# Patient Record
Sex: Female | Born: 1995
Health system: Southern US, Community
[De-identification: ages and names within clinical notes are randomized; demographics above are authoritative.]

## PROBLEM LIST (undated history)

## (undated) DIAGNOSIS — D649 Anemia, unspecified: Secondary | ICD-10-CM

## (undated) HISTORY — PX: EYE SURGERY: SHX253

---

## 2016-11-24 ENCOUNTER — Ambulatory Visit (INDEPENDENT_AMBULATORY_CARE_PROVIDER_SITE_OTHER): Payer: Medicaid Other | Admitting: Student

## 2016-11-24 ENCOUNTER — Encounter: Payer: Self-pay | Admitting: Student

## 2016-11-24 DIAGNOSIS — Z3400 Encounter for supervision of normal first pregnancy, unspecified trimester: Secondary | ICD-10-CM

## 2016-11-24 DIAGNOSIS — O0932 Supervision of pregnancy with insufficient antenatal care, second trimester: Secondary | ICD-10-CM

## 2016-11-24 DIAGNOSIS — O0931 Supervision of pregnancy with insufficient antenatal care, first trimester: Secondary | ICD-10-CM | POA: Insufficient documentation

## 2016-11-24 LAB — POCT URINALYSIS DIP (DEVICE)
BILIRUBIN URINE: NEGATIVE
Glucose, UA: NEGATIVE mg/dL
HGB URINE DIPSTICK: NEGATIVE
KETONES UR: NEGATIVE mg/dL
NITRITE: NEGATIVE
PH: 7.5 (ref 5.0–8.0)
Protein, ur: 30 mg/dL — AB
SPECIFIC GRAVITY, URINE: 1.02 (ref 1.005–1.030)
Urobilinogen, UA: 0.2 mg/dL (ref 0.0–1.0)

## 2016-11-24 NOTE — Patient Instructions (Signed)
Second Trimester of Pregnancy The second trimester is from week 13 through week 28, month 4 through 6. This is often the time in pregnancy that you feel your best. Often times, morning sickness has lessened or quit. You may have more energy, and you may get hungry more often. Your unborn baby (fetus) is growing rapidly. At the end of the sixth month, he or she is about 9 inches long and weighs about 1 pounds. You will likely feel the baby move (quickening) between 18 and 20 weeks of pregnancy. Follow these instructions at home:  Avoid all smoking, herbs, and alcohol. Avoid drugs not approved by your doctor.  Do not use any tobacco products, including cigarettes, chewing tobacco, and electronic cigarettes. If you need help quitting, ask your doctor. You may get counseling or other support to help you quit.  Only take medicine as told by your doctor. Some medicines are safe and some are not during pregnancy.  Exercise only as told by your doctor. Stop exercising if you start having cramps.  Eat regular, healthy meals.  Wear a good support bra if your breasts are tender.  Do not use hot tubs, steam rooms, or saunas.  Wear your seat belt when driving.  Avoid raw meat, uncooked cheese, and liter boxes and soil used by cats.  Take your prenatal vitamins.  Take 1500-2000 milligrams of calcium daily starting at the 20th week of pregnancy until you deliver your baby.  Try taking medicine that helps you poop (stool softener) as needed, and if your doctor approves. Eat more fiber by eating fresh fruit, vegetables, and whole grains. Drink enough fluids to keep your pee (urine) clear or pale yellow.  Take warm water baths (sitz baths) to soothe pain or discomfort caused by hemorrhoids. Use hemorrhoid cream if your doctor approves.  If you have puffy, bulging veins (varicose veins), wear support hose. Raise (elevate) your feet for 15 minutes, 3-4 times a day. Limit salt in your diet.  Avoid heavy  lifting, wear low heals, and sit up straight.  Rest with your legs raised if you have leg cramps or low back pain.  Visit your dentist if you have not gone during your pregnancy. Use a soft toothbrush to brush your teeth. Be gentle when you floss.  You can have sex (intercourse) unless your doctor tells you not to.  Go to your doctor visits. Get help if:  You feel dizzy.  You have mild cramps or pressure in your lower belly (abdomen).  You have a nagging pain in your belly area.  You continue to feel sick to your stomach (nauseous), throw up (vomit), or have watery poop (diarrhea).  You have bad smelling fluid coming from your vagina.  You have pain with peeing (urination). Get help right away if:  You have a fever.  You are leaking fluid from your vagina.  You have spotting or bleeding from your vagina.  You have severe belly cramping or pain.  You lose or gain weight rapidly.  You have trouble catching your breath and have chest pain.  You notice sudden or extreme puffiness (swelling) of your face, hands, ankles, feet, or legs.  You have not felt the baby move in over an hour.  You have severe headaches that do not go away with medicine.  You have vision changes. This information is not intended to replace advice given to you by your health care provider. Make sure you discuss any questions you have with your health care   provider. Document Released: 03/10/2010 Document Revised: 05/21/2016 Document Reviewed: 02/14/2013 Elsevier Interactive Patient Education  2017 [employer]  

## 2016-11-24 NOTE — Assessment & Plan Note (Signed)
Did not know she was pregnant; she thinks she is 25 weeks and 2 days

## 2016-11-24 NOTE — Progress Notes (Signed)
   PRENATAL VISIT NOTE  Subjective:  Patricia Rich is a 20 y.o. G1P0 at 6853w2d being seen today for ongoing prenatal care.  She is currently monitored for the following issues for this low-risk pregnancy and has Supervision of normal first pregnancy and Limited prenatal care in first trimester on her problem list.  Patient reports no complaints.  Contractions: Not present. Vag. Bleeding: None.  Movement: Absent. Denies leaking of fluid.   The following portions of the patient's history were reviewed and updated as appropriate: allergies, current medications, past family history, past medical history, past social history, past surgical history and problem list. Problem list updated.  Objective:   Vitals:   11/24/16 1246  BP: 109/66  Pulse: 90  Weight: 67 kg (147 lb 9.6 oz)    Fetal Status:     Movement: Absent     General:  Alert, oriented and cooperative. Patient is in no acute distress.  Skin: Skin is warm and dry. No rash noted.   Cardiovascular: Normal heart rate noted  Respiratory: Normal respiratory effort, no problems with respiration noted  Abdomen: Soft, gravid, appropriate for gestational age. Pain/Pressure: Absent     Pelvic:  Cervical exam deferred        Extremities: Normal range of motion.  Edema: None  Mental Status: Normal mood and affect. Normal behavior. Normal judgment and thought content.   Assessment and Plan:  Pregnancy: G1P0 at 653w2d  1. Supervision of normal first pregnancy, antepartum Patient feels  - Prenatal Profile - Culture, OB Urine - GC/Chlamydia probe amp ([place])[facility] - US MFM OB COMP + 14 WK; Future  2. Limited prenatal care in first trimester Patient has very unpredictable periods and did not know she was pregnant, and then had trouble getting an appointment. Answered questions today about genetic testing, US, series of appointments.   Preterm labor symptoms and general obstetric precautions including but not limited to  vaginal bleeding, contractions, leaking of fluid and fetal movement were reviewed in detail with the patient. Please refer to After Visit Summary for other counseling recommendations.  No Follow-up on file. Return in 3 weeks; 2 Hr GTT at that time.   Marylene LandKathryn Lorraine Kooistra, CNM

## 2016-11-25 LAB — PRENATAL PROFILE (SOLSTAS)
Antibody Screen: NEGATIVE
BASOS PCT: 0 %
Basophils Absolute: 0 cells/uL (ref 0–200)
Eosinophils Absolute: 92 cells/uL (ref 15–500)
Eosinophils Relative: 1 %
HEMATOCRIT: 32.3 % — AB (ref 35.0–45.0)
HEMOGLOBIN: 10.7 g/dL — AB (ref 11.7–15.5)
HEP B S AG: NEGATIVE
HIV: NONREACTIVE
LYMPHS ABS: 1196 {cells}/uL (ref 850–3900)
LYMPHS PCT: 13 %
MCH: 31 pg (ref 27.0–33.0)
MCHC: 33.1 g/dL (ref 32.0–36.0)
MCV: 93.6 fL (ref 80.0–100.0)
MONO ABS: 736 {cells}/uL (ref 200–950)
MPV: 10.5 fL (ref 7.5–12.5)
Monocytes Relative: 8 %
Neutro Abs: 7176 cells/uL (ref 1500–7800)
Neutrophils Relative %: 78 %
Platelets: 243 10*3/uL (ref 140–400)
RBC: 3.45 MIL/uL — AB (ref 3.80–5.10)
RDW: 13.2 % (ref 11.0–15.0)
RH TYPE: NEGATIVE
Rubella: 2.32 Index — ABNORMAL HIGH (ref <0.90)
WBC: 9.2 10*3/uL (ref 3.8–10.8)

## 2016-11-26 LAB — CULTURE, OB URINE

## 2016-11-30 ENCOUNTER — Ambulatory Visit (HOSPITAL_COMMUNITY)
Admission: RE | Admit: 2016-11-30 | Discharge: 2016-11-30 | Disposition: A | Discharge status: Home / Self Care | Payer: Medicaid Other | Source: Ambulatory Visit | Attending: Student | Admitting: Student

## 2016-11-30 DIAGNOSIS — Z363 Encounter for antenatal screening for malformations: Secondary | ICD-10-CM | POA: Diagnosis present

## 2016-11-30 DIAGNOSIS — Z3A18 18 weeks gestation of pregnancy: Secondary | ICD-10-CM | POA: Diagnosis not present

## 2016-11-30 DIAGNOSIS — Z3400 Encounter for supervision of normal first pregnancy, unspecified trimester: Secondary | ICD-10-CM

## 2016-12-17 ENCOUNTER — Ambulatory Visit (INDEPENDENT_AMBULATORY_CARE_PROVIDER_SITE_OTHER): Payer: Medicaid Other | Admitting: Certified Nurse Midwife

## 2016-12-17 VITALS — BP 111/69 | HR 80 | Wt 149.1 lb

## 2016-12-17 DIAGNOSIS — Z3402 Encounter for supervision of normal first pregnancy, second trimester: Secondary | ICD-10-CM

## 2016-12-17 DIAGNOSIS — O0932 Supervision of pregnancy with insufficient antenatal care, second trimester: Secondary | ICD-10-CM

## 2016-12-17 DIAGNOSIS — O0931 Supervision of pregnancy with insufficient antenatal care, first trimester: Secondary | ICD-10-CM

## 2016-12-17 NOTE — Patient Instructions (Signed)
Rh Incompatibility Rh incompatibility is a condition that occurs during pregnancy if a woman has Rh-negative blood and her baby has Rh-positive blood. "Rh-negative" and "Rh-positive" refer to whether or not the blood has an Rh factor. An Rh factor is a specific protein found on the surface of red blood cells. If a woman has Rh factor, she is Rh-positive. If she does not have an Rh factor, she is Rh-negative. Having or not having an Rh factor does not affect the mother's general health. However, it can cause problems during pregnancy. What kind of problems can Rh incompatibility cause? During pregnancy, blood from the baby can cross into the mother's bloodstream, especially during delivery. If a mother is Rh-negative and the baby is Rh-positive, the mother's defense system will react to the baby's blood as if it was a foreign substance and will create proteins (antibodies). This is called sensitization. Once the mother is sensitized, her Rh antibodies will cross the placenta to the baby and attack the baby's Rh-positive blood as if it is a harmful substance. Rh incompatibility can also happen if the Rh-negative pregnant woman is exposed to the Rh factor during a blood transfusion with Rh-positive blood. How does this condition affect my baby? The Rh antibodies that attack and destroy the baby's red blood cells can lead to hemolytic disease in the baby. Hemolytic disease is when the red blood cells break down. This can cause:  Yellowing of the skin and eyes (jaundice).  The body to not have enough healthy red blood cells (anemia).  Brain damage.  Heart failure.  Death.  These antibodies usually do not cause problems during a first pregnancy. This is because the blood from the baby often times crosses into the mother's bloodstream during delivery, and the baby is born before many of the antibodies can develop. However, the antibodies stay in your body once they have formed. Because of this, Rh  incompatibility is more likely to cause problems in second or later pregnancies (if the baby is Rh-positive). How is this diagnosed? When a woman becomes pregnant, blood tests may be done to find out her blood type and Rh factor. If the woman is Rh-negative, she also may have another blood test called an antibody screen. The antibody screen shows whether she has Rh antibodies in her blood. If she does, it means she was exposed to Rh-positive blood before, and she is at risk for Rh incompatibility. To find out whether the baby is developing hemolytic anemia and how serious it is, caregivers may use more advanced tests, such as ultrasonography (commonly known as ultrasound). How is Rh incompatibility treated? Rh incompatibility is treated with a shot of medicine called Rho (D) immune globulin. This medicine keeps the woman's body from making antibodies that can cause serious problems in the baby or future babies. Two shots will be given, one at around your seventh month of pregnancy and the other within 72 hours of your baby being born. If you are Rh-negative, you will need this medicine every time you have a baby with Rh-positive blood. If you already have antibodies in your blood, Rho (D) immune globulin will not help. Your doctor will not give you this medicine, but will watch your pregnancy closely for problems instead. This shot may also be given to an Rh-negative woman when the risk of blood transfer between the mom and baby is high. The risk is high with:  An amniocentesis.  A miscarriage or an abortion.  An ectopic pregnancy.  Any   vaginal bleeding during pregnancy.  This information is not intended to replace advice given to you by your health care provider. Make sure you discuss any questions you have with your health care provider. Document Released: 06/05/2002 Document Revised: 05/21/2016 Document Reviewed: 03/28/2013 Elsevier Interactive Patient Education  2017 [employer]vier Inc.  

## 2016-12-17 NOTE — Progress Notes (Signed)
Subjective:  Patricia Rich is a 20 y.o. G1P0 at 1717w4d being seen today for ongoing prenatal care.  She is currently monitored for the following issues for this low-risk pregnancy and has Supervision of normal first pregnancy and Limited prenatal care in first trimester on her problem list.  Patient reports diarrhea x2-3 days. Family members have had similar sx. No fever. No vomiting. Stools are irregular, watery, and green..  Contractions: Not present. Vag. Bleeding: None.  Movement: Present. Denies leaking of fluid.   The following portions of the patient's history were reviewed and updated as appropriate: allergies, current medications, past family history, past medical history, past social history, past surgical history and problem list. Problem list updated.  Objective:   Vitals:   12/17/16 0914  BP: 111/69  Pulse: 80    Fetal Status: Fetal Heart Rate (bpm): 154   Movement: Present     General:  Alert, oriented and cooperative. Patient is in no acute distress.  Skin: Skin is warm and dry. No rash noted.   Cardiovascular: Normal heart rate noted  Respiratory: Normal respiratory effort, no problems with respiration noted  Abdomen: Soft, gravid, appropriate for gestational age. Pain/Pressure: Present     Pelvic: Vag. Bleeding: None     Cervical exam deferred        Extremities: Normal range of motion.  Edema: Trace  Mental Status: Normal mood and affect. Normal behavior. Normal judgment and thought content.   Urinalysis:      Assessment and Plan:  Pregnancy: G1P0 at 4917w4d  1. Encounter for supervision of normal first pregnancy in second trimester - increase hydration d/t diarrhea - notify office if persists >may need stool analysis  2. Limited prenatal care in first trimester   Preterm labor symptoms and general obstetric precautions including but not limited to vaginal bleeding, contractions, leaking of fluid and fetal movement were reviewed in detail with the  patient. Please refer to After Visit Summary for other counseling recommendations.  Return in about 4 weeks (around 01/14/2017).   Donette LarryMelanie Lannie Heaps, CNM

## 2016-12-28 NOTE — L&D Delivery Note (Signed)
Operative Delivery Note At 12:37 PM a viable female was delivered via Vaginal, [employer], operational).  Presentation: vertex; Position: Left,, Occiput,, Anterior; Station: +2  For fetal bradycardia.Pt assisted x 2 pulls with spont del of fetal head.  Verbal consent: obtained from patient.  Risks and benefits discussed in detail.  Risks include, but are not limited to the risks of anesthesia, bleeding, infection, damage to maternal tissues, fetal cephalhematoma.  There is also the risk of inability to effect vaginal delivery of the head, or shoulder dystocia that cannot be resolved by established maneuvers, leading to the need for emergency cesarean section.  APGAR:8 , 9; weight  .   Placenta status: sponr, shultz.   Cord:  with the following complications:none .  Cord pH: n/a  Anesthesia epidural:   Instruments: kiwi Episiotomy: None Lacerations: None Suture Repair: n/a Est. Blood Loss 100(mL):    Mom to postpartum.  Baby to Couplet care / Skin to Skin.  Wyvonnia Dusky 04/25/2017, 12:44 PM

## 2017-01-13 ENCOUNTER — Encounter: Payer: Self-pay | Admitting: Family Medicine

## 2017-01-13 ENCOUNTER — Encounter: Payer: Medicaid Other | Admitting: Family Medicine

## 2017-01-19 ENCOUNTER — Ambulatory Visit (INDEPENDENT_AMBULATORY_CARE_PROVIDER_SITE_OTHER): Payer: Medicaid Other | Admitting: Advanced Practice Midwife

## 2017-01-19 VITALS — BP 102/65 | HR 99 | Wt 159.2 lb

## 2017-01-19 DIAGNOSIS — Z23 Encounter for immunization: Secondary | ICD-10-CM | POA: Diagnosis present

## 2017-01-19 DIAGNOSIS — Z3402 Encounter for supervision of normal first pregnancy, second trimester: Secondary | ICD-10-CM

## 2017-01-19 NOTE — Progress Notes (Signed)
Will wait on Flu

## 2017-01-19 NOTE — Patient Instructions (Signed)
Third Trimester of Pregnancy The third trimester is from week 29 through week 40 (months 7 through 9). The third trimester is a time when the unborn baby (fetus) is growing rapidly. At the end of the ninth month, the fetus is about 20 inches in length and weighs 6-10 pounds. Body changes during your third trimester Your body goes through many changes during pregnancy. The changes vary from woman to woman. During the third trimester:  Your weight will continue to increase. You can expect to gain 25-35 pounds (11-16 kg) by the end of the pregnancy.  You may begin to get stretch marks on your hips, abdomen, and breasts.  You may urinate more often because the fetus is moving lower into your pelvis and pressing on your bladder.  You may develop or continue to have heartburn. This is caused by increased hormones that slow down muscles in the digestive tract.  You may develop or continue to have constipation because increased hormones slow digestion and cause the muscles that push waste through your intestines to relax.  You may develop hemorrhoids. These are swollen veins (varicose veins) in the rectum that can itch or be painful.  You may develop swollen, bulging veins (varicose veins) in your legs.  You may have increased body aches in the pelvis, back, or thighs. This is due to weight gain and increased hormones that are relaxing your joints.  You may have changes in your hair. These can include thickening of your hair, rapid growth, and changes in texture. Some women also have hair loss during or after pregnancy, or hair that feels dry or thin. Your hair will most likely return to normal after your baby is born.  Your breasts will continue to grow and they will continue to become tender. A yellow fluid (colostrum) may leak from your breasts. This is the first milk you are producing for your baby.  Your belly button may stick out.  You may notice more swelling in your hands, face, or  ankles.  You may have increased tingling or numbness in your hands, arms, and legs. The skin on your belly may also feel numb.  You may feel short of breath because of your expanding uterus.  You may have more problems sleeping. This can be caused by the size of your belly, increased need to urinate, and an increase in your body's metabolism.  You may notice the fetus "dropping," or moving lower in your abdomen.  You may have increased vaginal discharge.  Your cervix becomes thin and soft (effaced) near your due date. What to expect at prenatal visits You will have prenatal exams every 2 weeks until week 36. Then you will have weekly prenatal exams. During a routine prenatal visit:  You will be weighed to make sure you and the fetus are growing normally.  Your blood pressure will be taken.  Your abdomen will be measured to track your baby's growth.  The fetal heartbeat will be listened to.  Any test results from the previous visit will be discussed.  You may have a cervical check near your due date to see if you have effaced. At around 36 weeks, your health care provider will check your cervix. At the same time, your health care provider will also perform a test on the secretions of the vaginal tissue. This test is to determine if a type of bacteria, Group B streptococcus, is present. Your health care provider will explain this further. Your health care provider may ask you:    What your birth plan is.  How you are feeling.  If you are feeling the baby move.  If you have had any abnormal symptoms, such as leaking fluid, bleeding, severe headaches, or abdominal cramping.  If you are using any tobacco products, including cigarettes, chewing tobacco, and electronic cigarettes.  If you have any questions. Other tests or screenings that may be performed during your third trimester include:  Blood tests that check for low iron levels (anemia).  Fetal testing to check the health,  activity level, and growth of the fetus. Testing is done if you have certain medical conditions or if there are problems during the pregnancy.  Nonstress test (NST). This test checks the health of your baby to make sure there are no signs of problems, such as the baby not getting enough oxygen. During this test, a belt is placed around your belly. The baby is made to move, and its heart rate is monitored during movement. What is false labor? False labor is a condition in which you feel small, irregular tightenings of the muscles in the womb (contractions) that eventually go away. These are called Braxton Hicks contractions. Contractions may last for hours, days, or even weeks before true labor sets in. If contractions come at regular intervals, become more frequent, increase in intensity, or become painful, you should see your health care provider. What are the signs of labor?  Abdominal cramps.  Regular contractions that start at 10 minutes apart and become stronger and more frequent with time.  Contractions that start on the top of the uterus and spread down to the lower abdomen and back.  Increased pelvic pressure and dull back pain.  A watery or bloody mucus discharge that comes from the vagina.  Leaking of amniotic fluid. This is also known as your "water breaking." It could be a slow trickle or a gush. Let your doctor know if it has a color or strange odor. If you have any of these signs, call your health care provider right away, even if it is before your due date. Follow these instructions at home: Eating and drinking  Continue to eat regular, healthy meals.  Do not eat:  Raw meat or meat spreads.  Unpasteurized milk or cheese.  Unpasteurized juice.  Store-made salad.  Refrigerated smoked seafood.  Hot dogs or deli meat, unless they are piping hot.  More than 6 ounces of albacore tuna a week.  Shark, swordfish, king mackerel, or tile fish.  Store-made salads.  Raw  sprouts, such as mung bean or alfalfa sprouts.  Take prenatal vitamins as told by your health care provider.  Take 1000 mg of calcium daily as told by your health care provider.  If you develop constipation:  Take over-the-counter or prescription medicines.  Drink enough fluid to keep your urine clear or pale yellow.  Eat foods that are high in fiber, such as fresh fruits and vegetables, whole grains, and beans.  Limit foods that are high in fat and processed sugars, such as fried and sweet foods. Activity  Exercise only as directed by your health care provider. Healthy pregnant women should aim for 2 hours and 30 minutes of moderate exercise per week. If you experience any pain or discomfort while exercising, stop.  Avoid heavy lifting.  Do not exercise in extreme heat or humidity, or at high altitudes.  Wear low-heel, comfortable shoes.  Practice good posture.  Do not travel far distances unless it is absolutely necessary and only with the approval   of your health care provider.  Wear your seat belt at all times while in a car, on a bus, or on a plane.  Take frequent breaks and rest with your legs elevated if you have leg cramps or low back pain.  Do not use hot tubs, steam rooms, or saunas.  You may continue to have sex unless your health care provider tells you otherwise. Lifestyle  Do not use any products that contain nicotine or tobacco, such as cigarettes and e-cigarettes. If you need help quitting, ask your health care provider.  Do not drink alcohol.  Do not use any medicinal herbs or unprescribed drugs. These chemicals affect the formation and growth of the baby.  If you develop varicose veins:  Wear support pantyhose or compression stockings as told by your healthcare provider.  Elevate your feet for 15 minutes, 3-4 times a day.  Wear a supportive maternity bra to help with breast tenderness. General instructions  Take over-the-counter and prescription  medicines only as told by your health care provider. There are medicines that are either safe or unsafe to take during pregnancy.  Take warm sitz baths to soothe any pain or discomfort caused by hemorrhoids. Use hemorrhoid cream or witch hazel if your health care provider approves.  Avoid cat litter boxes and soil used by cats. These carry germs that can cause birth defects in the baby. If you have a cat, ask someone to clean the litter box for you.  To prepare for the arrival of your baby:  Take prenatal classes to understand, practice, and ask questions about the labor and delivery.  Make a trial run to the hospital.  Visit the hospital and tour the maternity area.  Arrange for maternity or paternity leave through employers.  Arrange for family and friends to take care of pets while you are in the hospital.  Purchase a rear-facing car seat and make sure you know how to install it in your car.  Pack your hospital bag.  Prepare the baby's nursery. Make sure to remove all pillows and stuffed animals from the baby's crib to prevent suffocation.  Visit your dentist if you have not gone during your pregnancy. Use a soft toothbrush to brush your teeth and be gentle when you floss.  Keep all prenatal follow-up visits as told by your health care provider. This is important. Contact a health care provider if:  You are unsure if you are in labor or if your water has broken.  You become dizzy.  You have mild pelvic cramps, pelvic pressure, or nagging pain in your abdominal area.  You have lower back pain.  You have persistent nausea, vomiting, or diarrhea.  You have an unusual or bad smelling vaginal discharge.  You have pain when you urinate. Get help right away if:  You have a fever.  You are leaking fluid from your vagina.  You have spotting or bleeding from your vagina.  You have severe abdominal pain or cramping.  You have rapid weight loss or weight gain.  You have  shortness of breath with chest pain.  You notice sudden or extreme swelling of your face, hands, ankles, feet, or legs.  Your baby makes fewer than 10 movements in 2 hours.  You have severe headaches that do not go away with medicine.  You have vision changes. Summary  The third trimester is from week 29 through week 40, months 7 through 9. The third trimester is a time when the unborn baby (fetus)   is growing rapidly.  During the third trimester, your discomfort may increase as you and your baby continue to gain weight. You may have abdominal, leg, and back pain, sleeping problems, and an increased need to urinate.  During the third trimester your breasts will keep growing and they will continue to become tender. A yellow fluid (colostrum) may leak from your breasts. This is the first milk you are producing for your baby.  False labor is a condition in which you feel small, irregular tightenings of the muscles in the womb (contractions) that eventually go away. These are called Braxton Hicks contractions. Contractions may last for hours, days, or even weeks before true labor sets in.  Signs of labor can include: abdominal cramps; regular contractions that start at 10 minutes apart and become stronger and more frequent with time; watery or bloody mucus discharge that comes from the vagina; increased pelvic pressure and dull back pain; and leaking of amniotic fluid. This information is not intended to replace advice given to you by your health care provider. Make sure you discuss any questions you have with your health care provider. Document Released: 12/08/2001 Document Revised: 05/21/2016 Document Reviewed: 02/14/2013 Elsevier Interactive Patient Education  2017 [employer]  

## 2017-01-19 NOTE — Progress Notes (Signed)
   PRENATAL VISIT NOTE  Subjective:  Patricia Rich is a 21 y.o. G1P0 at 2614w2d being seen today for ongoing prenatal care.  She is currently monitored for the following issues for this low-risk pregnancy and has Supervision of normal first pregnancy and Limited prenatal care in first trimester on her problem list.  Patient reports feeling overheated/sweating sometimes, starting 1 week ago, and occasional milk leaking from breasts.  Contractions: Not present.  .  Movement: Present. Denies leaking of fluid.   The following portions of the patient's history were reviewed and updated as appropriate: allergies, current medications, past family history, past medical history, past social history, past surgical history and problem list. Problem list updated.  Objective:   Vitals:   01/19/17 1240  BP: 102/65  Pulse: 99  Weight: 159 lb 3.2 oz (72.2 kg)    Fetal Status: Fetal Heart Rate (bpm): 140 Fundal Height: 25 cm Movement: Present     General:  Alert, oriented and cooperative. Patient is in no acute distress.  Skin: Skin is warm and dry. No rash noted.   Cardiovascular: Normal heart rate noted  Respiratory: Normal respiratory effort, no problems with respiration noted  Abdomen: Soft, gravid, appropriate for gestational age. Pain/Pressure: Present     Pelvic:  Cervical exam deferred        Extremities: Normal range of motion.     Mental Status: Normal mood and affect. Normal behavior. Normal judgment and thought content.   Assessment and Plan:  Pregnancy: G1P0 at 6514w2d  1. Encounter for supervision of normal first pregnancy in second trimester --Discussed normal changes in pregnancy, temperature changes likely hormonal r/t pregnancy. Breast leaking also normal, pt may want to buy breast pads for postpartum and use now.  Preterm labor symptoms and general obstetric precautions including but not limited to vaginal bleeding, contractions, leaking of fluid and fetal movement were  reviewed in detail with the patient. Please refer to After Visit Summary for other counseling recommendations.  Return in about 4 weeks (around 02/16/2017).   Hurshel PartyLisa A Leftwich-Kirby, CNM

## 2017-02-18 ENCOUNTER — Ambulatory Visit (INDEPENDENT_AMBULATORY_CARE_PROVIDER_SITE_OTHER): Payer: Medicaid Other | Admitting: Advanced Practice Midwife

## 2017-02-18 VITALS — BP 113/74 | HR 88 | Wt 172.6 lb

## 2017-02-18 DIAGNOSIS — Z6791 Unspecified blood type, Rh negative: Secondary | ICD-10-CM

## 2017-02-18 DIAGNOSIS — O360930 Maternal care for other rhesus isoimmunization, third trimester, not applicable or unspecified: Secondary | ICD-10-CM

## 2017-02-18 DIAGNOSIS — O26893 Other specified pregnancy related conditions, third trimester: Secondary | ICD-10-CM

## 2017-02-18 DIAGNOSIS — Z3403 Encounter for supervision of normal first pregnancy, third trimester: Secondary | ICD-10-CM

## 2017-02-18 MED ORDER — RHO D IMMUNE GLOBULIN 1500 UNIT/2ML IJ SOSY
300.0000 ug | PREFILLED_SYRINGE | Freq: Once | INTRAMUSCULAR | Status: AC
  Administered 2017-02-18: 300 ug via INTRAMUSCULAR

## 2017-02-18 NOTE — Progress Notes (Signed)
   PRENATAL VISIT NOTE  Subjective:  Patricia Rich is a 21 y.o. G1P0 at 6185w4d being seen today for ongoing prenatal care.  She is currently monitored for the following issues for this low-risk pregnancy and has Supervision of normal first pregnancy and Limited prenatal care in first trimester on her problem list.  Patient reports no complaints.  Contractions: Not present. Vag. Bleeding: None.  Movement: Present. Denies leaking of fluid.   The following portions of the patient's history were reviewed and updated as appropriate: allergies, current medications, past family history, past medical history, past social history, past surgical history and problem list. Problem list updated.  Objective:   Vitals:   02/18/17 0813  BP: 113/74  Pulse: 88  Weight: 172 lb 9.6 oz (78.3 kg)    Fetal Status: Fetal Heart Rate (bpm): 138 Fundal Height: 29 cm Movement: Present     General:  Alert, oriented and cooperative. Patient is in no acute distress.  Skin: Skin is warm and dry. No rash noted.   Cardiovascular: Normal heart rate noted  Respiratory: Normal respiratory effort, no problems with respiration noted  Abdomen: Soft, gravid, appropriate for gestational age. Pain/Pressure: Present     Pelvic:  Cervical exam deferred        Extremities: Normal range of motion.  Edema: None  Mental Status: Normal mood and affect. Normal behavior. Normal judgment and thought content.   Assessment and Plan:  Pregnancy: G1P0 at 6985w4d  1. Encounter for supervision of normal first pregnancy in third trimester --28 week labs today - CBC - RPR - HIV antibody (with reflex) - Glucose Tolerance, 2 Hours w/1 Hour  2. Rh negative state in antepartum period, third trimester  - rho (d) immune globulin (RHIG/RHOPHYLAC) injection 300 mcg; Inject 2 mLs (300 mcg total) into the muscle once.  Preterm labor symptoms and general obstetric precautions including but not limited to vaginal bleeding, contractions,  leaking of fluid and fetal movement were reviewed in detail with the patient. Please refer to After Visit Summary for other counseling recommendations.  Return in about 2 weeks (around 03/04/2017).   Hurshel PartyLisa A Leftwich-Kirby, CNM

## 2017-02-18 NOTE — Patient Instructions (Signed)
Third Trimester of Pregnancy The third trimester is from week 29 through week 40 (months 7 through 9). The third trimester is a time when the unborn baby (fetus) is growing rapidly. At the end of the ninth month, the fetus is about 20 inches in length and weighs 6-10 pounds. Body changes during your third trimester Your body goes through many changes during pregnancy. The changes vary from woman to woman. During the third trimester:  Your weight will continue to increase. You can expect to gain 25-35 pounds (11-16 kg) by the end of the pregnancy.  You may begin to get stretch marks on your hips, abdomen, and breasts.  You may urinate more often because the fetus is moving lower into your pelvis and pressing on your bladder.  You may develop or continue to have heartburn. This is caused by increased hormones that slow down muscles in the digestive tract.  You may develop or continue to have constipation because increased hormones slow digestion and cause the muscles that push waste through your intestines to relax.  You may develop hemorrhoids. These are swollen veins (varicose veins) in the rectum that can itch or be painful.  You may develop swollen, bulging veins (varicose veins) in your legs.  You may have increased body aches in the pelvis, back, or thighs. This is due to weight gain and increased hormones that are relaxing your joints.  You may have changes in your hair. These can include thickening of your hair, rapid growth, and changes in texture. Some women also have hair loss during or after pregnancy, or hair that feels dry or thin. Your hair will most likely return to normal after your baby is born.  Your breasts will continue to grow and they will continue to become tender. A yellow fluid (colostrum) may leak from your breasts. This is the first milk you are producing for your baby.  Your belly button may stick out.  You may notice more swelling in your hands, face, or  ankles.  You may have increased tingling or numbness in your hands, arms, and legs. The skin on your belly may also feel numb.  You may feel short of breath because of your expanding uterus.  You may have more problems sleeping. This can be caused by the size of your belly, increased need to urinate, and an increase in your body's metabolism.  You may notice the fetus "dropping," or moving lower in your abdomen.  You may have increased vaginal discharge.  Your cervix becomes thin and soft (effaced) near your due date. What to expect at prenatal visits You will have prenatal exams every 2 weeks until week 36. Then you will have weekly prenatal exams. During a routine prenatal visit:  You will be weighed to make sure you and the fetus are growing normally.  Your blood pressure will be taken.  Your abdomen will be measured to track your baby's growth.  The fetal heartbeat will be listened to.  Any test results from the previous visit will be discussed.  You may have a cervical check near your due date to see if you have effaced. At around 36 weeks, your health care provider will check your cervix. At the same time, your health care provider will also perform a test on the secretions of the vaginal tissue. This test is to determine if a type of bacteria, Group B streptococcus, is present. Your health care provider will explain this further. Your health care provider may ask you:    What your birth plan is.  How you are feeling.  If you are feeling the baby move.  If you have had any abnormal symptoms, such as leaking fluid, bleeding, severe headaches, or abdominal cramping.  If you are using any tobacco products, including cigarettes, chewing tobacco, and electronic cigarettes.  If you have any questions. Other tests or screenings that may be performed during your third trimester include:  Blood tests that check for low iron levels (anemia).  Fetal testing to check the health,  activity level, and growth of the fetus. Testing is done if you have certain medical conditions or if there are problems during the pregnancy.  Nonstress test (NST). This test checks the health of your baby to make sure there are no signs of problems, such as the baby not getting enough oxygen. During this test, a belt is placed around your belly. The baby is made to move, and its heart rate is monitored during movement. What is false labor? False labor is a condition in which you feel small, irregular tightenings of the muscles in the womb (contractions) that eventually go away. These are called Braxton Hicks contractions. Contractions may last for hours, days, or even weeks before true labor sets in. If contractions come at regular intervals, become more frequent, increase in intensity, or become painful, you should see your health care provider. What are the signs of labor?  Abdominal cramps.  Regular contractions that start at 10 minutes apart and become stronger and more frequent with time.  Contractions that start on the top of the uterus and spread down to the lower abdomen and back.  Increased pelvic pressure and dull back pain.  A watery or bloody mucus discharge that comes from the vagina.  Leaking of amniotic fluid. This is also known as your "water breaking." It could be a slow trickle or a gush. Let your doctor know if it has a color or strange odor. If you have any of these signs, call your health care provider right away, even if it is before your due date. Follow these instructions at home: Eating and drinking  Continue to eat regular, healthy meals.  Do not eat:  Raw meat or meat spreads.  Unpasteurized milk or cheese.  Unpasteurized juice.  Store-made salad.  Refrigerated smoked seafood.  Hot dogs or deli meat, unless they are piping hot.  More than 6 ounces of albacore tuna a week.  Shark, swordfish, king mackerel, or tile fish.  Store-made salads.  Raw  sprouts, such as mung bean or alfalfa sprouts.  Take prenatal vitamins as told by your health care provider.  Take 1000 mg of calcium daily as told by your health care provider.  If you develop constipation:  Take over-the-counter or prescription medicines.  Drink enough fluid to keep your urine clear or pale yellow.  Eat foods that are high in fiber, such as fresh fruits and vegetables, whole grains, and beans.  Limit foods that are high in fat and processed sugars, such as fried and sweet foods. Activity  Exercise only as directed by your health care provider. Healthy pregnant women should aim for 2 hours and 30 minutes of moderate exercise per week. If you experience any pain or discomfort while exercising, stop.  Avoid heavy lifting.  Do not exercise in extreme heat or humidity, or at high altitudes.  Wear low-heel, comfortable shoes.  Practice good posture.  Do not travel far distances unless it is absolutely necessary and only with the approval   of your health care provider.  Wear your seat belt at all times while in a car, on a bus, or on a plane.  Take frequent breaks and rest with your legs elevated if you have leg cramps or low back pain.  Do not use hot tubs, steam rooms, or saunas.  You may continue to have sex unless your health care provider tells you otherwise. Lifestyle  Do not use any products that contain nicotine or tobacco, such as cigarettes and e-cigarettes. If you need help quitting, ask your health care provider.  Do not drink alcohol.  Do not use any medicinal herbs or unprescribed drugs. These chemicals affect the formation and growth of the baby.  If you develop varicose veins:  Wear support pantyhose or compression stockings as told by your healthcare provider.  Elevate your feet for 15 minutes, 3-4 times a day.  Wear a supportive maternity bra to help with breast tenderness. General instructions  Take over-the-counter and prescription  medicines only as told by your health care provider. There are medicines that are either safe or unsafe to take during pregnancy.  Take warm sitz baths to soothe any pain or discomfort caused by hemorrhoids. Use hemorrhoid cream or witch hazel if your health care provider approves.  Avoid cat litter boxes and soil used by cats. These carry germs that can cause birth defects in the baby. If you have a cat, ask someone to clean the litter box for you.  To prepare for the arrival of your baby:  Take prenatal classes to understand, practice, and ask questions about the labor and delivery.  Make a trial run to the hospital.  Visit the hospital and tour the maternity area.  Arrange for maternity or paternity leave through employers.  Arrange for family and friends to take care of pets while you are in the hospital.  Purchase a rear-facing car seat and make sure you know how to install it in your car.  Pack your hospital bag.  Prepare the baby's nursery. Make sure to remove all pillows and stuffed animals from the baby's crib to prevent suffocation.  Visit your dentist if you have not gone during your pregnancy. Use a soft toothbrush to brush your teeth and be gentle when you floss.  Keep all prenatal follow-up visits as told by your health care provider. This is important. Contact a health care provider if:  You are unsure if you are in labor or if your water has broken.  You become dizzy.  You have mild pelvic cramps, pelvic pressure, or nagging pain in your abdominal area.  You have lower back pain.  You have persistent nausea, vomiting, or diarrhea.  You have an unusual or bad smelling vaginal discharge.  You have pain when you urinate. Get help right away if:  You have a fever.  You are leaking fluid from your vagina.  You have spotting or bleeding from your vagina.  You have severe abdominal pain or cramping.  You have rapid weight loss or weight gain.  You have  shortness of breath with chest pain.  You notice sudden or extreme swelling of your face, hands, ankles, feet, or legs.  Your baby makes fewer than 10 movements in 2 hours.  You have severe headaches that do not go away with medicine.  You have vision changes. Summary  The third trimester is from week 29 through week 40, months 7 through 9. The third trimester is a time when the unborn baby (fetus)   is growing rapidly.  During the third trimester, your discomfort may increase as you and your baby continue to gain weight. You may have abdominal, leg, and back pain, sleeping problems, and an increased need to urinate.  During the third trimester your breasts will keep growing and they will continue to become tender. A yellow fluid (colostrum) may leak from your breasts. This is the first milk you are producing for your baby.  False labor is a condition in which you feel small, irregular tightenings of the muscles in the womb (contractions) that eventually go away. These are called Braxton Hicks contractions. Contractions may last for hours, days, or even weeks before true labor sets in.  Signs of labor can include: abdominal cramps; regular contractions that start at 10 minutes apart and become stronger and more frequent with time; watery or bloody mucus discharge that comes from the vagina; increased pelvic pressure and dull back pain; and leaking of amniotic fluid. This information is not intended to replace advice given to you by your health care provider. Make sure you discuss any questions you have with your health care provider. Document Released: 12/08/2001 Document Revised: 05/21/2016 Document Reviewed: 02/14/2013 Elsevier Interactive Patient Education  2017 [employer]  

## 2017-02-19 LAB — CBC
HEMOGLOBIN: 9.8 g/dL — AB (ref 11.1–15.9)
Hematocrit: 29.7 % — ABNORMAL LOW (ref 34.0–46.6)
MCH: 30.2 pg (ref 26.6–33.0)
MCHC: 33 g/dL (ref 31.5–35.7)
MCV: 92 fL (ref 79–97)
PLATELETS: 281 10*3/uL (ref 150–379)
RBC: 3.24 x10E6/uL — ABNORMAL LOW (ref 3.77–5.28)
RDW: 12.7 % (ref 12.3–15.4)
WBC: 9.1 10*3/uL (ref 3.4–10.8)

## 2017-02-19 LAB — RPR: RPR: NONREACTIVE

## 2017-02-19 LAB — GLUCOSE TOLERANCE, 2 HOURS W/ 1HR
GLUCOSE, 2 HOUR: 122 mg/dL (ref 65–152)
Glucose, 1 hour: 79 mg/dL (ref 65–179)
Glucose, Fasting: 82 mg/dL (ref 65–91)

## 2017-02-19 LAB — HIV ANTIBODY (ROUTINE TESTING W REFLEX): HIV SCREEN 4TH GENERATION: NONREACTIVE

## 2017-02-20 ENCOUNTER — Encounter: Payer: Self-pay | Admitting: Advanced Practice Midwife

## 2017-02-20 DIAGNOSIS — O99019 Anemia complicating pregnancy, unspecified trimester: Secondary | ICD-10-CM | POA: Insufficient documentation

## 2017-03-03 ENCOUNTER — Ambulatory Visit (INDEPENDENT_AMBULATORY_CARE_PROVIDER_SITE_OTHER): Payer: Medicaid Other | Admitting: Obstetrics and Gynecology

## 2017-03-03 VITALS — BP 116/76 | HR 101 | Wt 178.6 lb

## 2017-03-03 DIAGNOSIS — Z3403 Encounter for supervision of normal first pregnancy, third trimester: Secondary | ICD-10-CM

## 2017-03-03 NOTE — Progress Notes (Signed)
C/o swelling at times in hands and feet. Noted 6 lb weight gain. Denies headaches  Or visual changes. C/o sharp pains epigastric. Would like tdap next visit because going out of town to funeral.

## 2017-03-03 NOTE — Progress Notes (Signed)
   PRENATAL VISIT NOTE  Subjective:  Patricia Rich is a 21 y.o. G1P0 at 6045w23d being seen today for ongoing prenatal care.  She is currently monitored for the following issues for this low-risk pregnancy and has Supervision of normal first pregnancy; Limited prenatal care in first trimester; and Anemia affecting pregnancy on her problem list.  Patient reports concerns about stretch marks, leg cramps, sharp fleeting discomfort with FM. FOB concerned with her dietary indiscretions. .  Contractions: Not present. Vag. Bleeding: None.  Movement: Present. Denies leaking of fluid.   The following portions of the patient's history were reviewed and updated as appropriate: allergies, current medications, past family history, past medical history, past social history, past surgical history and problem list. Problem list updated.  Objective:   Vitals:   03/03/17 0905  BP: 116/76  Pulse: (!) 101  Weight: 178 lb 9.6 oz (81 kg)    Fetal Status: Fetal Heart Rate (bpm): 144   Movement: Present     General:  Alert, oriented and cooperative. Patient is in no acute distress.  Skin: Skin is warm and dry. No rash noted.   Cardiovascular: Normal heart rate noted  Respiratory: Normal respiratory effort, no problems with respiration noted  Abdomen: Soft, gravid, appropriate for gestational age. Pain/Pressure: Present     Pelvic:  Cervical exam deferred        Extremities: Normal range of motion.  Edema: Trace  Mental Status: Normal mood and affect. Normal behavior. Normal judgment and thought content.   Assessment and Plan:  Pregnancy: G1P0 at [redacted]w[redacted]d  1. Encounter for supervision of normal first pregnancy in third trimester Discussed pregnancy discomforts and healthy diet. Will curtail intake of processed and empty calorie foods.   Preterm labor symptoms and general obstetric precautions including but not limited to vaginal bleeding, contractions, leaking of fluid and fetal movement were reviewed in  detail with the patient. Please refer to After Visit Summary for other counseling recommendations.  Return in 2 weeks (on 03/17/2017).   Danae Orleanseirdre C Ichael Pullara, CNM

## 2017-03-03 NOTE — Patient Instructions (Addendum)
 Third Trimester of Pregnancy The third trimester is from week 28 through week 40 (months 7 through 9). The third trimester is a time when the unborn baby (fetus) is growing rapidly. At the end of the ninth month, the fetus is about 20 inches in length and weighs 6-10 pounds. Body changes during your third trimester Your body will continue to go through many changes during pregnancy. The changes vary from woman to woman. During the third trimester:  Your weight will continue to increase. You can expect to gain 25-35 pounds (11-16 kg) by the end of the pregnancy.  You may begin to get stretch marks on your hips, abdomen, and breasts.  You may urinate more often because the fetus is moving lower into your pelvis and pressing on your bladder.  You may develop or continue to have heartburn. This is caused by increased hormones that slow down muscles in the digestive tract.  You may develop or continue to have constipation because increased hormones slow digestion and cause the muscles that push waste through your intestines to relax.  You may develop hemorrhoids. These are swollen veins (varicose veins) in the rectum that can itch or be painful.  You may develop swollen, bulging veins (varicose veins) in your legs.  You may have increased body aches in the pelvis, back, or thighs. This is due to weight gain and increased hormones that are relaxing your joints.  You may have changes in your hair. These can include thickening of your hair, rapid growth, and changes in texture. Some women also have hair loss during or after pregnancy, or hair that feels dry or thin. Your hair will most likely return to normal after your baby is born.  Your breasts will continue to grow and they will continue to become tender. A yellow fluid (colostrum) may leak from your breasts. This is the first milk you are producing for your baby.  Your belly button may stick out.  You may notice more swelling in your  hands, face, or ankles.  You may have increased tingling or numbness in your hands, arms, and legs. The skin on your belly may also feel numb.  You may feel short of breath because of your expanding uterus.  You may have more problems sleeping. This can be caused by the size of your belly, increased need to urinate, and an increase in your body's metabolism.  You may notice the fetus "dropping," or moving lower in your abdomen (lightening).  You may have increased vaginal discharge.  You may notice your joints feel loose and you may have pain around your pelvic bone. What to expect at prenatal visits You will have prenatal exams every 2 weeks until week 36. Then you will have weekly prenatal exams. During a routine prenatal visit:  You will be weighed to make sure you and the baby are growing normally.  Your blood pressure will be taken.  Your abdomen will be measured to track your baby's growth.  The fetal heartbeat will be listened to.  Any test results from the previous visit will be discussed.  You may have a cervical check near your due date to see if your cervix has softened or thinned (effaced).  You will be tested for Group B streptococcus. This happens between 35 and 37 weeks. Your health care provider may ask you:  What your birth plan is.  How you are feeling.  If you are feeling the baby move.  If you have had any   abnormal symptoms, such as leaking fluid, bleeding, severe headaches, or abdominal cramping.  If you are using any tobacco products, including cigarettes, chewing tobacco, and electronic cigarettes.  If you have any questions. Other tests or screenings that may be performed during your third trimester include:  Blood tests that check for low iron levels (anemia).  Fetal testing to check the health, activity level, and growth of the fetus. Testing is done if you have certain medical conditions or if there are problems during the  pregnancy.  Nonstress test (NST). This test checks the health of your baby to make sure there are no signs of problems, such as the baby not getting enough oxygen. During this test, a belt is placed around your belly. The baby is made to move, and its heart rate is monitored during movement. What is false labor? False labor is a condition in which you feel small, irregular tightenings of the muscles in the womb (contractions) that usually go away with rest, changing position, or drinking water. These are called Braxton Hicks contractions. Contractions may last for hours, days, or even weeks before true labor sets in. If contractions come at regular intervals, become more frequent, increase in intensity, or become painful, you should see your health care provider. What are the signs of labor?  Abdominal cramps.  Regular contractions that start at 10 minutes apart and become stronger and more frequent with time.  Contractions that start on the top of the uterus and spread down to the lower abdomen and back.  Increased pelvic pressure and dull back pain.  A watery or bloody mucus discharge that comes from the vagina.  Leaking of amniotic fluid. This is also known as your "water breaking." It could be a slow trickle or a gush. Let your health care provider know if it has a color or strange odor. If you have any of these signs, call your health care provider right away, even if it is before your due date. Follow these instructions at home: Medicines   Follow your health care provider's instructions regarding medicine use. Specific medicines may be either safe or unsafe to take during pregnancy.  Take a prenatal vitamin that contains at least 600 micrograms (mcg) of folic acid.  If you develop constipation, try taking a stool softener if your health care provider approves. Eating and drinking   Eat a balanced diet that includes fresh fruits and vegetables, whole grains, good sources of protein  such as meat, eggs, or tofu, and low-fat dairy. Your health care provider will help you determine the amount of weight gain that is right for you.  Avoid raw meat and uncooked cheese. These carry germs that can cause birth defects in the baby.  If you have low calcium intake from food, talk to your health care provider about whether you should take a daily calcium supplement.  Eat four or five small meals rather than three large meals a day.  Limit foods that are high in fat and processed sugars, such as fried and sweet foods.  To prevent constipation:  Drink enough fluid to keep your urine clear or pale yellow.  Eat foods that are high in fiber, such as fresh fruits and vegetables, whole grains, and beans. Activity   Exercise only as directed by your health care provider. Most women can continue their usual exercise routine during pregnancy. Try to exercise for 30 minutes at least 5 days a week. Stop exercising if you experience uterine contractions.  Avoid heavy   lifting.  Do not exercise in extreme heat or humidity, or at high altitudes.  Wear low-heel, comfortable shoes.  Practice good posture.  You may continue to have sex unless your health care provider tells you otherwise. Relieving pain and discomfort   Take frequent breaks and rest with your legs elevated if you have leg cramps or low back pain.  Take warm sitz baths to soothe any pain or discomfort caused by hemorrhoids. Use hemorrhoid cream if your health care provider approves.  Wear a good support bra to prevent discomfort from breast tenderness.  If you develop varicose veins:  Wear support pantyhose or compression stockings as told by your healthcare provider.  Elevate your feet for 15 minutes, 3-4 times a day. Prenatal care   Write down your questions. Take them to your prenatal visits.  Keep all your prenatal visits as told by your health care provider. This is important. Safety   Wear your seat belt  at all times when driving.  Make a list of emergency phone numbers, including numbers for family, friends, the hospital, and police and fire departments. General instructions   Avoid cat litter boxes and soil used by cats. These carry germs that can cause birth defects in the baby. If you have a cat, ask someone to clean the litter box for you.  Do not travel far distances unless it is absolutely necessary and only with the approval of your health care provider.  Do not use hot tubs, steam rooms, or saunas.  Do not drink alcohol.  Do not use any products that contain nicotine or tobacco, such as cigarettes and e-cigarettes. If you need help quitting, ask your health care provider.  Do not use any medicinal herbs or unprescribed drugs. These chemicals affect the formation and growth of the baby.  Do not douche or use tampons or scented sanitary pads.  Do not cross your legs for long periods of time.  To prepare for the arrival of your baby:  Take prenatal classes to understand, practice, and ask questions about labor and delivery.  Make a trial run to the hospital.  Visit the hospital and tour the maternity area.  Arrange for maternity or paternity leave through employers.  Arrange for family and friends to take care of pets while you are in the hospital.  Purchase a rear-facing car seat and make sure you know how to install it in your car.  Pack your hospital bag.  Prepare the baby's nursery. Make sure to remove all pillows and stuffed animals from the baby's crib to prevent suffocation.  Visit your dentist if you have not gone during your pregnancy. Use a soft toothbrush to brush your teeth and be gentle when you floss. Contact a health care provider if:  You are unsure if you are in labor or if your water has broken.  You become dizzy.  You have mild pelvic cramps, pelvic pressure, or nagging pain in your abdominal area.  You have lower back pain.  You have  persistent nausea, vomiting, or diarrhea.  You have an unusual or bad smelling vaginal discharge.  You have pain when you urinate. Get help right away if:  Your water breaks before 37 weeks.  You have regular contractions less than 5 minutes apart before 37 weeks.  You have a fever.  You are leaking fluid from your vagina.  You have spotting or bleeding from your vagina.  You have severe abdominal pain or cramping.  You have rapid weight   loss or weight gain.  You have shortness of breath with chest pain.  You notice sudden or extreme swelling of your face, hands, ankles, feet, or legs.  Your baby makes fewer than 10 movements in 2 hours.  You have severe headaches that do not go away when you take medicine.  You have vision changes. Summary  The third trimester is from week 28 through week 40, months 7 through 9. The third trimester is a time when the unborn baby (fetus) is growing rapidly.  During the third trimester, your discomfort may increase as you and your baby continue to gain weight. You may have abdominal, leg, and back pain, sleeping problems, and an increased need to urinate.  During the third trimester your breasts will keep growing and they will continue to become tender. A yellow fluid (colostrum) may leak from your breasts. This is the first milk you are producing for your baby.  False labor is a condition in which you feel small, irregular tightenings of the muscles in the womb (contractions) that eventually go away. These are called Braxton Hicks contractions. Contractions may last for hours, days, or even weeks before true labor sets in.  Signs of labor can include: abdominal cramps; regular contractions that start at 10 minutes apart and become stronger and more frequent with time; watery or bloody mucus discharge that comes from the vagina; increased pelvic pressure and dull back pain; and leaking of amniotic fluid. This information is not intended to  replace advice given to you by your health care provider. Make sure you discuss any questions you have with your health care provider. Document Released: 12/08/2001 Document Revised: 05/21/2016 Document Reviewed: 02/14/2013 Elsevier Interactive Patient Education  2017 [employer] Leg Cramps Leg cramps occur when a muscle or muscles tighten and you have no control over this tightening (involuntary muscle contraction). Muscle cramps can develop in any muscle, but the most common place is in the calf muscles of the leg. Those cramps can occur during exercise or when you are at rest. Leg cramps are painful, and they may last for a few seconds to a few minutes. Cramps may return several times before they finally stop. Usually, leg cramps are not caused by a serious medical problem. In many cases, the cause is not known. Some common causes include:  Overexertion.  Overuse from repetitive motions, or doing the same thing over and over.  Remaining in a certain position for a long period of time.  Improper preparation, form, or technique while performing a sport or an activity.  Dehydration.  Injury.  Side effects of some medicines.  Abnormally low levels of the salts and ions in your blood (electrolytes), especially potassium and calcium. These levels could be low if you are taking water pills (diuretics) or if you are pregnant. Follow these instructions at home: Watch your condition for any changes. Taking the following actions may help to lessen any discomfort that you are feeling:  Stay well-hydrated. Drink enough fluid to keep your urine clear or pale yellow.  Try massaging, stretching, and relaxing the affected muscle. Do this for several minutes at a time.  For tight or tense muscles, use a warm towel, heating pad, or hot shower water directed to the affected area.  If you are sore or have pain after a cramp, applying ice to the affected area may relieve discomfort.  Put ice in a  plastic bag.  Place a towel between your skin and the bag.  Leave the  ice on for 20 minutes, 2-3 times per day.  Avoid strenuous exercise for several days if you have been having frequent leg cramps.  Make sure that your diet includes the essential minerals for your muscles to work normally.  Take medicines only as directed by your health care provider. Contact a health care provider if:  Your leg cramps get more severe or more frequent, or they do not improve over time.  Your foot becomes cold, numb, or blue. This information is not intended to replace advice given to you by your health care provider. Make sure you discuss any questions you have with your health care provider. Document Released: 01/21/2005 Document Revised: 05/21/2016 Document Reviewed: 11/21/2014 Elsevier Interactive Patient Education  2017 [employer]

## 2017-03-04 ENCOUNTER — Encounter: Payer: Medicaid Other | Admitting: Obstetrics and Gynecology

## 2017-03-17 ENCOUNTER — Encounter: Payer: Medicaid Other | Admitting: Family Medicine

## 2017-03-18 ENCOUNTER — Encounter: Payer: Self-pay | Admitting: Advanced Practice Midwife

## 2017-03-18 ENCOUNTER — Ambulatory Visit (INDEPENDENT_AMBULATORY_CARE_PROVIDER_SITE_OTHER): Payer: Medicaid Other | Admitting: Advanced Practice Midwife

## 2017-03-18 VITALS — BP 121/67 | HR 93 | Wt 186.1 lb

## 2017-03-18 DIAGNOSIS — Z23 Encounter for immunization: Secondary | ICD-10-CM

## 2017-03-18 DIAGNOSIS — Z3403 Encounter for supervision of normal first pregnancy, third trimester: Secondary | ICD-10-CM

## 2017-03-18 NOTE — Progress Notes (Signed)
   PRENATAL VISIT NOTE  Subjective:  Patricia Rich is a 21 y.o. G1P0 at 5971w4d being seen today for ongoing prenatal care.  She is currently monitored for the following issues for this low-risk pregnancy and has Supervision of normal first pregnancy; Limited prenatal care in first trimester; and Anemia affecting pregnancy on her problem list.  Patient reports no complaints.  Contractions: Irritability. Vag. Bleeding: None.  Movement: Present. Denies leaking of fluid.   The following portions of the patient's history were reviewed and updated as appropriate: allergies, current medications, past family history, past medical history, past social history, past surgical history and problem list. Problem list updated.  Objective:   Vitals:   03/18/17 1008  BP: 121/67  Pulse: 93  Weight: 186 lb 1.6 oz (84.4 kg)    Fetal Status: Fetal Heart Rate (bpm): 146   Movement: Present     General:  Alert, oriented and cooperative. Patient is in no acute distress.  Skin: Skin is warm and dry. No rash noted.   Cardiovascular: Normal heart rate noted  Respiratory: Normal respiratory effort, no problems with respiration noted  Abdomen: Soft, gravid, appropriate for gestational age. Pain/Pressure: Absent     Pelvic:  Cervical exam deferred        Extremities: Normal range of motion.  Edema: Trace  Mental Status: Normal mood and affect. Normal behavior. Normal judgment and thought content.   Assessment and Plan:  Pregnancy: G1P0 at 771w4d  1. Need for Tdap vaccination     Done today - Tdap vaccine greater than or equal to 7yo IM  2. Encounter for supervision of normal first pregnancy in third trimester      Reviewed plan for cultures next visit      Warning signs reviewed. Knows where [facility] is      Had questions about birth plan.  Wants to donate cord blood.  OK with IV, may want to delay or avoid epidural  Preterm labor symptoms and general obstetric precautions including but not limited to  vaginal bleeding, contractions, leaking of fluid and fetal movement were reviewed in detail with the patient. Please refer to After Visit Summary for other counseling recommendations.   RTO 1 week   Aviva SignsMarie L Berdella Bacot, CNM

## 2017-03-18 NOTE — Patient Instructions (Signed)

## 2017-03-30 ENCOUNTER — Ambulatory Visit (INDEPENDENT_AMBULATORY_CARE_PROVIDER_SITE_OTHER): Payer: Medicaid Other | Admitting: Medical

## 2017-03-30 ENCOUNTER — Encounter: Payer: Self-pay | Admitting: Medical

## 2017-03-30 VITALS — BP 119/71 | HR 102 | Wt 191.0 lb

## 2017-03-30 DIAGNOSIS — Z3403 Encounter for supervision of normal first pregnancy, third trimester: Secondary | ICD-10-CM

## 2017-03-30 NOTE — Progress Notes (Signed)
   PRENATAL VISIT NOTE  Subjective:  Patricia Rich is a 21 y.o. G1P0 at [redacted]w[redacted]d being seen today for ongoing prenatal care.  She is currently monitored for the following issues for this low-risk pregnancy and has Supervision of normal first pregnancy; Limited prenatal care in first trimester; and Anemia affecting pregnancy on her problem list.  Patient reports no complaints.  Contractions: Not present. Vag. Bleeding: None.  Movement: Present. Denies leaking of fluid.   The following portions of the patient's history were reviewed and updated as appropriate: allergies, current medications, past family history, past medical history, past social history, past surgical history and problem list. Problem list updated.  Objective:   Vitals:   03/30/17 0804  BP: 119/71  Pulse: (!) 102  Weight: 191 lb (86.6 kg)    Fetal Status: Fetal Heart Rate (bpm): 142 Fundal Height: 34 cm Movement: Present     General:  Alert, oriented and cooperative. Patient is in no acute distress.  Skin: Skin is warm and dry. No rash noted.   Cardiovascular: Normal heart rate noted  Respiratory: Normal respiratory effort, no problems with respiration noted  Abdomen: Soft, gravid, appropriate for gestational age. Pain/Pressure: Absent     Pelvic:  Cervical exam deferred        Extremities: Normal range of motion.  Edema: Trace  Mental Status: Normal mood and affect. Normal behavior. Normal judgment and thought content.   Assessment and Plan:  Pregnancy: G1P0 at [redacted]w[redacted]d  1. Encounter for supervision of normal first pregnancy in third trimester - Doing well. Requests note for work restrictions. 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  Patient states that her boss is trying to get her to stay on even though they had agreed she would stop working after this week. She does not plan to return to work after the baby is born.  Note with restrictions given.   Preterm labor symptoms and general obstetric precautions including  but not limited to vaginal bleeding, contractions, leaking of fluid and fetal movement were reviewed in detail with the patient. Please refer to After Visit Summary for other counseling recommendations.  Return in about 1 week (around 04/06/2017) for LOB.   Marny Lowenstein, PA-C

## 2017-03-30 NOTE — Patient Instructions (Signed)
Braxton Hicks Contractions °Contractions of the uterus can occur throughout pregnancy, but they are not always a sign that you are in labor. You may have practice contractions called Braxton Hicks contractions. These false labor contractions are sometimes confused with true labor. °What are Braxton Hicks contractions? °Braxton Hicks contractions are tightening movements that occur in the muscles of the uterus before labor. Unlike true labor contractions, these contractions do not result in opening (dilation) and thinning of the cervix. Toward the end of pregnancy (32-34 weeks), Braxton Hicks contractions can happen more often and may become stronger. These contractions are sometimes difficult to tell apart from true labor because they can be very uncomfortable. You should not feel embarrassed if you go to the hospital with false labor. °Sometimes, the only way to tell if you are in true labor is for your health care provider to look for changes in the cervix. The health care provider will do a physical exam and may monitor your contractions. If you are not in true labor, the exam should show that your cervix is not dilating and your water has not broken. °If there are no prenatal problems or other health problems associated with your pregnancy, it is completely safe for you to be sent home with false labor. You may continue to have Braxton Hicks contractions until you go into true labor. °How can I tell the difference between true labor and false labor? °· Differences °¨ False labor °¨ Contractions last 30-70 seconds.: Contractions are usually shorter and not as strong as true labor contractions. °¨ Contractions become very regular.: Contractions are usually irregular. °¨ Discomfort is usually felt in the top of the uterus, and it spreads to the lower abdomen and low back.: Contractions are often felt in the front of the lower abdomen and in the groin. °¨ Contractions do not go away with walking.: Contractions may  go away when you walk around or change positions while lying down. °¨ Contractions usually become more intense and increase in frequency.: Contractions get weaker and are shorter-lasting as time goes on. °¨ The cervix dilates and gets thinner.: The cervix usually does not dilate or become thin. °Follow these instructions at home: °¨ Take over-the-counter and prescription medicines only as told by your health care provider. °¨ Keep up with your usual exercises and follow other instructions from your health care provider. °¨ Eat and drink lightly if you think you are going into labor. °¨ If Braxton Hicks contractions are making you uncomfortable: °¨ Change your position from lying down or resting to walking, or change from walking to resting. °¨ Sit and rest in a tub of warm water. °¨ Drink enough fluid to keep your urine clear or pale yellow. Dehydration may cause these contractions. °¨ Do slow and deep breathing several times an hour. °¨ Keep all follow-up prenatal visits as told by your health care provider. This is important. °Contact a health care provider if: °¨ You have a fever. °¨ You have continuous pain in your abdomen. °Get help right away if: °¨ Your contractions become stronger, more regular, and closer together. °¨ You have fluid leaking or gushing from your vagina. °¨ You pass blood-tinged mucus (bloody show). °¨ You have bleeding from your vagina. °¨ You have low back pain that you never had before. °¨ You feel your baby’s head pushing down and causing pelvic pressure. °¨ Your baby is not moving inside you as much as it used to. °Summary °¨ Contractions that occur before labor are   called Braxton Hicks contractions, false labor, or practice contractions. °¨ Braxton Hicks contractions are usually shorter, weaker, farther apart, and less regular than true labor contractions. True labor contractions usually become progressively stronger and regular and they become more frequent. °¨ Manage discomfort from  Braxton Hicks contractions by changing position, resting in a warm bath, drinking plenty of water, or practicing deep breathing. °This information is not intended to replace advice given to you by your health care provider. Make sure you discuss any questions you have with your health care provider. °Document Released: 12/14/2005 Document Revised: 11/02/2016 Document Reviewed: 11/02/2016 °Elsevier Interactive Patient Education © 2017 [employer] °Fetal Movement Counts °Patient Name: ________________________________________________ Patient Due Date: ____________________ °What is a fetal movement count? °A fetal movement count is the number of times that you feel your baby move during a certain amount of time. This may also be called a fetal kick count. A fetal movement count is recommended for every pregnant woman. You may be asked to start counting fetal movements as early as week 28 of your pregnancy. °Pay attention to when your baby is most active. You may notice your baby's sleep and wake cycles. You may also notice things that make your baby move more. You should do a fetal movement count: °· When your baby is normally most active. °· At the same time each day. °A good time to count movements is while you are resting, after having something to eat and drink. °How do I count fetal movements? °1. Find a quiet, comfortable area. Sit, or lie down on your side. °2. Write down the date, the start time and stop time, and the number of movements that you felt between those two times. Take this information with you to your health care visits. °3. For 2 hours, count kicks, flutters, swishes, rolls, and jabs. You should feel at least 10 movements during 2 hours. °4. You may stop counting after you have felt 10 movements. °5. If you do not feel 10 movements in 2 hours, have something to eat and drink. Then, keep resting and counting for 1 hour. If you feel at least 4 movements during that hour, you may stop  counting. °Contact a health care provider if: °· You feel fewer than 4 movements in 2 hours. °· Your baby is not moving like he or she usually does. °Date: ____________ Start time: ____________ Stop time: ____________ Movements: ____________ °Date: ____________ Start time: ____________ Stop time: ____________ Movements: ____________ °Date: ____________ Start time: ____________ Stop time: ____________ Movements: ____________ °Date: ____________ Start time: ____________ Stop time: ____________ Movements: ____________ °Date: ____________ Start time: ____________ Stop time: ____________ Movements: ____________ °Date: ____________ Start time: ____________ Stop time: ____________ Movements: ____________ °Date: ____________ Start time: ____________ Stop time: ____________ Movements: ____________ °Date: ____________ Start time: ____________ Stop time: ____________ Movements: ____________ °Date: ____________ Start time: ____________ Stop time: ____________ Movements: ____________ °This information is not intended to replace advice given to you by your health care provider. Make sure you discuss any questions you have with your health care provider. °Document Released: 01/13/2007 Document Revised: 08/12/2016 Document Reviewed: 01/23/2016 °Elsevier Interactive Patient Education © 2017 [employer] ° °

## 2017-04-06 ENCOUNTER — Ambulatory Visit (INDEPENDENT_AMBULATORY_CARE_PROVIDER_SITE_OTHER): Payer: Medicaid Other | Admitting: Family Medicine

## 2017-04-06 ENCOUNTER — Other Ambulatory Visit (HOSPITAL_COMMUNITY)
Admission: RE | Admit: 2017-04-06 | Discharge: 2017-04-06 | Disposition: A | Discharge status: Home / Self Care | Payer: Medicaid Other | Source: Ambulatory Visit | Attending: Advanced Practice Midwife | Admitting: Advanced Practice Midwife

## 2017-04-06 VITALS — BP 110/76 | HR 81 | Wt 191.1 lb

## 2017-04-06 DIAGNOSIS — Z3493 Encounter for supervision of normal pregnancy, unspecified, third trimester: Secondary | ICD-10-CM

## 2017-04-06 DIAGNOSIS — Z3490 Encounter for supervision of normal pregnancy, unspecified, unspecified trimester: Secondary | ICD-10-CM

## 2017-04-06 LAB — OB RESULTS CONSOLE GBS: GBS: NEGATIVE

## 2017-04-06 NOTE — Progress Notes (Signed)
   PRENATAL VISIT NOTE  Subjective:  Patricia Rich is a 21 y.o. G1P0 at [redacted]w[redacted]d being seen today for ongoing prenatal care.  She is currently monitored for the following issues for this low-risk pregnancy and has Supervision of normal first pregnancy; Limited prenatal care in first trimester; and Anemia affecting pregnancy on her problem list.  Patient reports no complaints.  Contractions: Not present. Vag. Bleeding: None.  Movement: Present. Denies leaking of fluid.   The following portions of the patient's history were reviewed and updated as appropriate: allergies, current medications, past family history, past medical history, past social history, past surgical history and problem list. Problem list updated.  Objective:   Vitals:   04/06/17 0901  BP: 110/76  Pulse: 81  Weight: 191 lb 1.6 oz (86.7 kg)    Fetal Status: Fetal Heart Rate (bpm): 138 Fundal Height: 36 cm Movement: Present     General:  Alert, oriented and cooperative. Patient is in no acute distress.  Skin: Skin is warm and dry. No rash noted.   Cardiovascular: Normal heart rate noted  Respiratory: Normal respiratory effort, no problems with respiration noted  Abdomen: Soft, gravid, appropriate for gestational age. Pain/Pressure: Present     Pelvic:  Cervical exam deferred        Extremities: Normal range of motion.  Edema: Mild pitting, slight indentation  Mental Status: Normal mood and affect. Normal behavior. Normal judgment and thought content.   Assessment and Plan:  Pregnancy: G1P0 at [redacted]w[redacted]d  1. Encounter for supervision of low-risk pregnancy, antepartum FHT and FH normal. - GC/Chlamydia probe amp ([place])not at [facility] - Culture, beta strep (group b only)  Term labor symptoms and general obstetric precautions including but not limited to vaginal bleeding, contractions, leaking of fluid and fetal movement were reviewed in detail with the patient. Please refer to After Visit Summary for other counseling  recommendations.  Return in about 1 week (around 04/13/2017) for LR OB f/u - please schedule every week x4.   Levie Heritage, DO

## 2017-04-07 LAB — GC/CHLAMYDIA PROBE AMP (~~LOC~~) NOT AT ARMC
CHLAMYDIA, DNA PROBE: NEGATIVE
Neisseria Gonorrhea: NEGATIVE

## 2017-04-07 LAB — OB RESULTS CONSOLE GC/CHLAMYDIA: GC PROBE AMP, GENITAL: NEGATIVE

## 2017-04-10 LAB — CULTURE, BETA STREP (GROUP B ONLY): STREP GP B CULTURE: NEGATIVE

## 2017-04-15 ENCOUNTER — Ambulatory Visit (INDEPENDENT_AMBULATORY_CARE_PROVIDER_SITE_OTHER): Payer: Medicaid Other | Admitting: Student

## 2017-04-15 VITALS — BP 121/73 | HR 100 | Wt 192.0 lb

## 2017-04-15 DIAGNOSIS — Z3403 Encounter for supervision of normal first pregnancy, third trimester: Secondary | ICD-10-CM

## 2017-04-15 DIAGNOSIS — O2686 Pruritic urticarial papules and plaques of pregnancy (PUPPP): Secondary | ICD-10-CM

## 2017-04-15 MED ORDER — PREDNISONE 20 MG PO TABS: 60.0000 mg | ORAL_TABLET | Freq: Every day | ORAL | 0 refills | Status: AC

## 2017-04-15 MED ORDER — TRIAMCINOLONE ACETONIDE 0.5 % EX OINT: 1.0000 "application " | TOPICAL_OINTMENT | Freq: Two times a day (BID) | CUTANEOUS | 1 refills | Status: DC

## 2017-04-15 MED ORDER — HYDROXYZINE PAMOATE 25 MG PO CAPS: 25.0000 mg | ORAL_CAPSULE | Freq: Three times a day (TID) | ORAL | 0 refills | Status: DC | PRN

## 2017-04-15 NOTE — Progress Notes (Signed)
   PRENATAL VISIT NOTE  Subjective:  Patricia Rich is a 21 y.o. G1P0 at [redacted]w[redacted]d being seen today for ongoing prenatal care.  She is currently monitored for the following issues for this low-risk pregnancy and has Supervision of normal first pregnancy; Limited prenatal care in first trimester; Anemia affecting pregnancy; and PUPP (pruritic urticarial papules and plaques of pregnancy) on her problem list.  Patient reports itching in her stria that has now spread to arms and legs. Has tried Benadryl with no relief. .  Contractions: Not present. Vag. Bleeding: None.  Movement: Present. Denies leaking of fluid.   The following portions of the patient's history were reviewed and updated as appropriate: allergies, current medications, past family history, past medical history, past social history, past surgical history and problem list. Problem list updated.  Objective:   Vitals:   04/15/17 0949  BP: 121/73  Pulse: 100  Weight: 192 lb (87.1 kg)    Fetal Status: Fetal Heart Rate (bpm): 140   Movement: Present     General:  Alert, oriented and cooperative. Patient is in no acute distress.  Skin: Skin is warm and dry. Striae are red and raised; small red papules observed on knees, forearms, hands.   Cardiovascular: Normal heart rate noted  Respiratory: Normal respiratory effort, no problems with respiration noted  Abdomen: Soft, gravid, appropriate for gestational age. Pain/Pressure: Absent     Pelvic:  Cervical exam deferred        Extremities: Normal range of motion.  Edema: Mild pitting, slight indentation  Mental Status: Normal mood and affect. Normal behavior. Normal judgment and thought content.   Assessment and Plan:  Pregnancy: G1P0 at [redacted]w[redacted]d  1. Encounter for supervision of normal first pregnancy in third trimester Patient doing well; feeling the baby move a lot.   2. PUPP (pruritic urticarial papules and plaques of pregnancy) -Rx for prednisone burst, Kenalog cream and vistaril  sent to pharmacy  Term labor symptoms and general obstetric precautions including but not limited to vaginal bleeding, contractions, leaking of fluid and fetal movement were reviewed in detail with the patient. Please refer to After Visit Summary for other counseling recommendations.  No Follow-up on file.   Marylene Land, CNM

## 2017-04-15 NOTE — Progress Notes (Signed)
Patient reports a week ago her stretch marks on her belly started to get puffy and itchy but within the past few days she has developed a rash all over that itches. Denies usage of new products or foods

## 2017-04-15 NOTE — Patient Instructions (Signed)
Pruritic Urticarial Papules and Plaques of Pregnancy When you are pregnant, your body changes in many ways. That includes the skin. Rashes sometimes develop. One skin rash that can happen during pregnancy is called pruritic urticarial papules and plaques of pregnancy (PUPPP). The small red bumps sometimes form large plaques. These are very itchy. The rash usually appears in the last few weeks of pregnancy during the third trimester. Sometimes, it can occur shortly after giving birth. It goes away shortly after your baby is born. It does not harm you or your baby and will not leave scars on your skin. PUPPP is most common in first pregnancies or in those involving more than one baby. It usually will not return during later pregnancies. What are the causes? The exact cause is unknown. However, it may be related to your skin stretching rapidly due to pregnancy. What are the signs or symptoms? PUPPP symptoms include a very itchy rash. The rash often looks red and raised and is most often seen on the abdomen. It can spread to the legs, thighs, or arms. Sometimes tiny blisters form in the center of the rash patches. The skin around the rash is often pale. How is this diagnosed? To decide if you have PUPPP, your health care provider will perform a physical exam and ask questions about your symptoms. He or she may order blood tests to rule out other causes of the rash. How is this treated? The goal is to stop the itching and keep the rash from spreading. Usually, a cream is used to do this. However, treatment varies. Common options include medicines that relieve or lessen itching. Some medicines may be in the form of a cream or ointment, while others you may take by mouth (orally). The medicines are either corticosteroids or antihistamines. Treatment helps nearly all women with this rash. The creams, ointments, or pills should make your skin feel better fairly quickly. Follow these instructions at home:  Only  take over-the-counter or prescription medicines as directed by your health care provider.  Apply any creams as directed by your health care provider.  Do not scratch the rash.  Wear loose clothing.  Keep all follow-up appointments with your health care provider. Contact a health care provider if:  The itching does not go away after treatment.  Your rash continues to spread.  You are unable to sleep because of the irritation. This information is not intended to replace advice given to you by your health care provider. Make sure you discuss any questions you have with your health care provider. Document Released: 03/10/2010 Document Revised: 05/21/2016 Document Reviewed: 05/28/2013 Elsevier Interactive Patient Education  2017 [employer] Inc.  

## 2017-04-21 ENCOUNTER — Ambulatory Visit (INDEPENDENT_AMBULATORY_CARE_PROVIDER_SITE_OTHER): Payer: Medicaid Other | Admitting: Family Medicine

## 2017-04-21 VITALS — BP 122/71 | HR 78 | Wt 199.5 lb

## 2017-04-21 DIAGNOSIS — Z3403 Encounter for supervision of normal first pregnancy, third trimester: Secondary | ICD-10-CM

## 2017-04-21 DIAGNOSIS — D649 Anemia, unspecified: Secondary | ICD-10-CM

## 2017-04-21 DIAGNOSIS — O0933 Supervision of pregnancy with insufficient antenatal care, third trimester: Secondary | ICD-10-CM

## 2017-04-21 DIAGNOSIS — O99013 Anemia complicating pregnancy, third trimester: Secondary | ICD-10-CM

## 2017-04-21 DIAGNOSIS — O0931 Supervision of pregnancy with insufficient antenatal care, first trimester: Secondary | ICD-10-CM

## 2017-04-21 DIAGNOSIS — O2686 Pruritic urticarial papules and plaques of pregnancy (PUPPP): Secondary | ICD-10-CM

## 2017-04-21 MED ORDER — TRIAMCINOLONE ACETONIDE 0.5 % EX OINT: 1.0000 "application " | TOPICAL_OINTMENT | Freq: Three times a day (TID) | CUTANEOUS | 1 refills | Status: DC | PRN

## 2017-04-21 NOTE — Patient Instructions (Signed)
Pruritic Urticarial Papules and Plaques of Pregnancy When you are pregnant, your body changes in many ways. That includes the skin. Rashes sometimes develop. One skin rash that can happen during pregnancy is called pruritic urticarial papules and plaques of pregnancy (PUPPP). The small red bumps sometimes form large plaques. These are very itchy. The rash usually appears in the last few weeks of pregnancy during the third trimester. Sometimes, it can occur shortly after giving birth. It goes away shortly after your baby is born. It does not harm you or your baby and will not leave scars on your skin. PUPPP is most common in first pregnancies or in those involving more than one baby. It usually will not return during later pregnancies. What are the causes? The exact cause is unknown. However, it may be related to your skin stretching rapidly due to pregnancy. What are the signs or symptoms? PUPPP symptoms include a very itchy rash. The rash often looks red and raised and is most often seen on the abdomen. It can spread to the legs, thighs, or arms. Sometimes tiny blisters form in the center of the rash patches. The skin around the rash is often pale. How is this diagnosed? To decide if you have PUPPP, your health care provider will perform a physical exam and ask questions about your symptoms. He or she may order blood tests to rule out other causes of the rash. How is this treated? The goal is to stop the itching and keep the rash from spreading. Usually, a cream is used to do this. However, treatment varies. Common options include medicines that relieve or lessen itching. Some medicines may be in the form of a cream or ointment, while others you may take by mouth (orally). The medicines are either corticosteroids or antihistamines. Treatment helps nearly all women with this rash. The creams, ointments, or pills should make your skin feel better fairly quickly. Follow these instructions at home:  Only  take over-the-counter or prescription medicines as directed by your health care provider.  Apply any creams as directed by your health care provider.  Do not scratch the rash.  Wear loose clothing.  Keep all follow-up appointments with your health care provider. Contact a health care provider if:  The itching does not go away after treatment.  Your rash continues to spread.  You are unable to sleep because of the irritation. This information is not intended to replace advice given to you by your health care provider. Make sure you discuss any questions you have with your health care provider. Document Released: 03/10/2010 Document Revised: 05/21/2016 Document Reviewed: 05/28/2013 Elsevier Interactive Patient Education  2017 [employer] Inc.  

## 2017-04-21 NOTE — Progress Notes (Signed)
Patient has rash which has gotten worse, itchy raised. Generalized to whole body.

## 2017-04-21 NOTE — Progress Notes (Signed)
      Subjective:  Patricia Rich is a 21 y.o. G1P0 at [redacted]w[redacted]d being seen today for ongoing prenatal care.  She is currently monitored for the following issues for this low-risk pregnancy and has Supervision of normal first pregnancy; Limited prenatal care in first trimester; Anemia affecting pregnancy; and PUPP (pruritic urticarial papules and plaques of pregnancy) on her problem list.  Patient reports Severe itching. Has PUPPs rash on abdomen that has spread to all extremities. Has had severe itching of palms and soles. Steroid cream, vistaril are not working. Contractions: Irritability. Vag. Bleeding: None.  Movement: Present. Denies leaking of fluid.   The following portions of the patient's history were reviewed and updated as appropriate: allergies, current medications, past family history, past medical history, past social history, past surgical history and problem list. Problem list updated.  Objective:   Vitals:   04/21/17 0907  BP: 122/71  Pulse: 78  Weight: 199 lb 8 oz (90.5 kg)    Fetal Status: Fetal Heart Rate (bpm): 141 Fundal Height: 38 cm Movement: Present      General:  Alert, oriented and cooperative. Patient is in no acute distress.  Skin: Skin is warm and dry. No rash noted.   Cardiovascular: Normal heart rate noted  Respiratory: Normal respiratory effort, no problems with respiration noted  Abdomen: Soft, gravid, appropriate for gestational age. Pain/Pressure: Present     Pelvic:  Cervical exam deferred        Extremities: Normal range of motion.  Edema: Mild pitting, slight indentation  Mental Status: Normal mood and affect. Normal behavior. Normal judgment and thought content.   Urinalysis:      Assessment and Plan:  Pregnancy: G1P0 at [redacted]w[redacted]d  There are no diagnoses linked to this encounter.  MOC: IUD - Mirena MOF: Breast Term labor symptoms and general obstetric precautions including but not limited to vaginal bleeding, contractions, leaking of fluid  and fetal movement were reviewed in detail with the patient. Please refer to After Visit Summary for other counseling recommendations.  Return in about 1 week (around 04/28/2017) for Routine OB visit.   Jen Mow, DO [facility], [facility]

## 2017-04-22 ENCOUNTER — Inpatient Hospital Stay (HOSPITAL_COMMUNITY)
Admission: AD | Admit: 2017-04-22 | Discharge: 2017-04-22 | Disposition: A | Discharge status: Home / Self Care | Payer: Medicaid Other | Source: Ambulatory Visit | Attending: Obstetrics & Gynecology | Admitting: Obstetrics & Gynecology

## 2017-04-22 ENCOUNTER — Encounter (HOSPITAL_COMMUNITY): Payer: Self-pay

## 2017-04-22 DIAGNOSIS — Z3A38 38 weeks gestation of pregnancy: Secondary | ICD-10-CM | POA: Insufficient documentation

## 2017-04-22 DIAGNOSIS — O99013 Anemia complicating pregnancy, third trimester: Secondary | ICD-10-CM

## 2017-04-22 DIAGNOSIS — O4703 False labor before 37 completed weeks of gestation, third trimester: Secondary | ICD-10-CM | POA: Insufficient documentation

## 2017-04-22 DIAGNOSIS — O479 False labor, unspecified: Secondary | ICD-10-CM

## 2017-04-22 HISTORY — DX: Anemia, unspecified: D64.9

## 2017-04-22 LAB — COMPREHENSIVE METABOLIC PANEL
A/G RATIO: 1.1 — AB (ref 1.2–2.2)
ALT: 15 IU/L (ref 0–32)
AST: 16 IU/L (ref 0–40)
Albumin: 3.2 g/dL — ABNORMAL LOW (ref 3.5–5.5)
Alkaline Phosphatase: 126 IU/L — ABNORMAL HIGH (ref 39–117)
BUN/Creatinine Ratio: 20 (ref 9–23)
BUN: 9 mg/dL (ref 6–20)
CHLORIDE: 100 mmol/L (ref 96–106)
CO2: 22 mmol/L (ref 18–29)
Calcium: 9.1 mg/dL (ref 8.7–10.2)
Creatinine, Ser: 0.45 mg/dL — ABNORMAL LOW (ref 0.57–1.00)
GFR calc Af Amer: 164 mL/min/{1.73_m2} (ref >59)
GFR calc non Af Amer: 143 mL/min/{1.73_m2} (ref >59)
Globulin, Total: 2.9 g/dL (ref 1.5–4.5)
Glucose: 87 mg/dL (ref 65–99)
POTASSIUM: 4.5 mmol/L (ref 3.5–5.2)
Sodium: 138 mmol/L (ref 134–144)
Total Protein: 6.1 g/dL (ref 6.0–8.5)

## 2017-04-22 LAB — URINALYSIS, ROUTINE W REFLEX MICROSCOPIC
BILIRUBIN URINE: NEGATIVE
Glucose, UA: NEGATIVE mg/dL
Hgb urine dipstick: NEGATIVE
KETONES UR: NEGATIVE mg/dL
Leukocytes, UA: NEGATIVE
NITRITE: NEGATIVE
PH: 8 (ref 5.0–8.0)
Protein, ur: NEGATIVE mg/dL
Specific Gravity, Urine: 1.006 (ref 1.005–1.030)

## 2017-04-22 LAB — BILE ACIDS, TOTAL: Bile Acids Total: 10.5 umol/L (ref 4.7–24.5)

## 2017-04-22 NOTE — MAU Note (Signed)
Pt has been having ctx since this morning. No bleeding or ROM

## 2017-04-22 NOTE — MAU Note (Signed)
Provider notified of exam and variable FHR. Drenda Freeze advised tracing was ok and pt can go home with labor precautions.

## 2017-04-22 NOTE — Discharge Instructions (Signed)
Braxton Hicks Contractions °Contractions of the uterus can occur throughout pregnancy, but they are not always a sign that you are in labor. You may have practice contractions called Braxton Hicks contractions. These false labor contractions are sometimes confused with true labor. °What are Braxton Hicks contractions? °Braxton Hicks contractions are tightening movements that occur in the muscles of the uterus before labor. Unlike true labor contractions, these contractions do not result in opening (dilation) and thinning of the cervix. Toward the end of pregnancy (32-34 weeks), Braxton Hicks contractions can happen more often and may become stronger. These contractions are sometimes difficult to tell apart from true labor because they can be very uncomfortable. You should not feel embarrassed if you go to the hospital with false labor. °Sometimes, the only way to tell if you are in true labor is for your health care provider to look for changes in the cervix. The health care provider will do a physical exam and may monitor your contractions. If you are not in true labor, the exam should show that your cervix is not dilating and your water has not broken. °If there are no prenatal problems or other health problems associated with your pregnancy, it is completely safe for you to be sent home with false labor. You may continue to have Braxton Hicks contractions until you go into true labor. °How can I tell the difference between true labor and false labor? °· Differences °¨ False labor °¨ Contractions last 30-70 seconds.: Contractions are usually shorter and not as strong as true labor contractions. °¨ Contractions become very regular.: Contractions are usually irregular. °¨ Discomfort is usually felt in the top of the uterus, and it spreads to the lower abdomen and low back.: Contractions are often felt in the front of the lower abdomen and in the groin. °¨ Contractions do not go away with walking.: Contractions may  go away when you walk around or change positions while lying down. °¨ Contractions usually become more intense and increase in frequency.: Contractions get weaker and are shorter-lasting as time goes on. °¨ The cervix dilates and gets thinner.: The cervix usually does not dilate or become thin. °Follow these instructions at home: °¨ Take over-the-counter and prescription medicines only as told by your health care provider. °¨ Keep up with your usual exercises and follow other instructions from your health care provider. °¨ Eat and drink lightly if you think you are going into labor. °¨ If Braxton Hicks contractions are making you uncomfortable: °¨ Change your position from lying down or resting to walking, or change from walking to resting. °¨ Sit and rest in a tub of warm water. °¨ Drink enough fluid to keep your urine clear or pale yellow. Dehydration may cause these contractions. °¨ Do slow and deep breathing several times an hour. °¨ Keep all follow-up prenatal visits as told by your health care provider. This is important. °Contact a health care provider if: °¨ You have a fever. °¨ You have continuous pain in your abdomen. °Get help right away if: °¨ Your contractions become stronger, more regular, and closer together. °¨ You have fluid leaking or gushing from your vagina. °¨ You pass blood-tinged mucus (bloody show). °¨ You have bleeding from your vagina. °¨ You have low back pain that you never had before. °¨ You feel your baby’s head pushing down and causing pelvic pressure. °¨ Your baby is not moving inside you as much as it used to. °Summary °¨ Contractions that occur before labor are   called Braxton Hicks contractions, false labor, or practice contractions.  Braxton Hicks contractions are usually shorter, weaker, farther apart, and less regular than true labor contractions. True labor contractions usually become progressively stronger and regular and they become more frequent.  Manage discomfort from  [facility] contractions by changing position, resting in a warm bath, drinking plenty of water, or practicing deep breathing. This information is not intended to replace advice given to you by your health care provider. Make sure you discuss any questions you have with your health care provider. Document Released: 12/14/2005 Document Revised: 11/02/2016 Document Reviewed: 11/02/2016 Elsevier Interactive Patient Education  2017 [employer]. Third Trimester of Pregnancy The third trimester is from week 28 through week 40 (months 7 through 9). The third trimester is a time when the unborn baby (fetus) is growing rapidly. At the end of the ninth month, the fetus is about 20 inches in length and weighs 6-10 pounds. Body changes during your third trimester Your body will continue to go through many changes during pregnancy. The changes vary from woman to woman. During the third trimester:  Your weight will continue to increase. You can expect to gain 25-35 pounds (11-16 kg) by the end of the pregnancy.  You may begin to get stretch marks on your hips, abdomen, and breasts.  You may urinate more often because the fetus is moving lower into your pelvis and pressing on your bladder.  You may develop or continue to have heartburn. This is caused by increased hormones that slow down muscles in the digestive tract.  You may develop or continue to have constipation because increased hormones slow digestion and cause the muscles that push waste through your intestines to relax.  You may develop hemorrhoids. These are swollen veins (varicose veins) in the rectum that can itch or be painful.  You may develop swollen, bulging veins (varicose veins) in your legs.  You may have increased body aches in the pelvis, back, or thighs. This is due to weight gain and increased hormones that are relaxing your joints.  You may have changes in your hair. These can include thickening of your hair, rapid growth, and  changes in texture. Some women also have hair loss during or after pregnancy, or hair that feels dry or thin. Your hair will most likely return to normal after your baby is born.  Your breasts will continue to grow and they will continue to become tender. A yellow fluid (colostrum) may leak from your breasts. This is the first milk you are producing for your baby.  Your belly button may stick out.  You may notice more swelling in your hands, face, or ankles.  You may have increased tingling or numbness in your hands, arms, and legs. The skin on your belly may also feel numb.  You may feel short of breath because of your expanding uterus.  You may have more problems sleeping. This can be caused by the size of your belly, increased need to urinate, and an increase in your body's metabolism.  You may notice the fetus "dropping," or moving lower in your abdomen (lightening).  You may have increased vaginal discharge.  You may notice your joints feel loose and you may have pain around your pelvic bone. What to expect at prenatal visits You will have prenatal exams every 2 weeks until week 36. Then you will have weekly prenatal exams. During a routine prenatal visit:  You will be weighed to make sure you and the baby  are growing normally.  Your blood pressure will be taken.  Your abdomen will be measured to track your baby's growth.  The fetal heartbeat will be listened to.  Any test results from the previous visit will be discussed.  You may have a cervical check near your due date to see if your cervix has softened or thinned (effaced).  You will be tested for Group B streptococcus. This happens between 35 and 37 weeks. Your health care provider may ask you:  What your birth plan is.  How you are feeling.  If you are feeling the baby move.  If you have had any abnormal symptoms, such as leaking fluid, bleeding, severe headaches, or abdominal cramping.  If you are using any  tobacco products, including cigarettes, chewing tobacco, and electronic cigarettes.  If you have any questions. Other tests or screenings that may be performed during your third trimester include:  Blood tests that check for low iron levels (anemia).  Fetal testing to check the health, activity level, and growth of the fetus. Testing is done if you have certain medical conditions or if there are problems during the pregnancy.  Nonstress test (NST). This test checks the health of your baby to make sure there are no signs of problems, such as the baby not getting enough oxygen. During this test, a belt is placed around your belly. The baby is made to move, and its heart rate is monitored during movement. What is false labor? False labor is a condition in which you feel small, irregular tightenings of the muscles in the womb (contractions) that usually go away with rest, changing position, or drinking water. These are called Braxton Hicks contractions. Contractions may last for hours, days, or even weeks before true labor sets in. If contractions come at regular intervals, become more frequent, increase in intensity, or become painful, you should see your health care provider. What are the signs of labor?  Abdominal cramps.  Regular contractions that start at 10 minutes apart and become stronger and more frequent with time.  Contractions that start on the top of the uterus and spread down to the lower abdomen and back.  Increased pelvic pressure and dull back pain.  A watery or bloody mucus discharge that comes from the vagina.  Leaking of amniotic fluid. This is also known as your "water breaking." It could be a slow trickle or a gush. Let your health care provider know if it has a color or strange odor. If you have any of these signs, call your health care provider right away, even if it is before your due date. Follow these instructions at home: Medicines   Follow your health care  provider's instructions regarding medicine use. Specific medicines may be either safe or unsafe to take during pregnancy.  Take a prenatal vitamin that contains at least 600 micrograms (mcg) of folic acid.  If you develop constipation, try taking a stool softener if your health care provider approves. Eating and drinking   Eat a balanced diet that includes fresh fruits and vegetables, whole grains, good sources of protein such as meat, eggs, or tofu, and low-fat dairy. Your health care provider will help you determine the amount of weight gain that is right for you.  Avoid raw meat and uncooked cheese. These carry germs that can cause birth defects in the baby.  If you have low calcium intake from food, talk to your health care provider about whether you should take a daily calcium supplement.  Eat four or five small meals rather than three large meals a day.  Limit foods that are high in fat and processed sugars, such as fried and sweet foods.  To prevent constipation:  Drink enough fluid to keep your urine clear or pale yellow.  Eat foods that are high in fiber, such as fresh fruits and vegetables, whole grains, and beans. Activity   Exercise only as directed by your health care provider. Most women can continue their usual exercise routine during pregnancy. Try to exercise for 30 minutes at least 5 days a week. Stop exercising if you experience uterine contractions.  Avoid heavy lifting.  Do not exercise in extreme heat or humidity, or at high altitudes.  Wear low-heel, comfortable shoes.  Practice good posture.  You may continue to have sex unless your health care provider tells you otherwise. Relieving pain and discomfort   Take frequent breaks and rest with your legs elevated if you have leg cramps or low back pain.  Take warm sitz baths to soothe any pain or discomfort caused by hemorrhoids. Use hemorrhoid cream if your health care provider approves.  Wear a good  support bra to prevent discomfort from breast tenderness.  If you develop varicose veins:  Wear support pantyhose or compression stockings as told by your healthcare provider.  Elevate your feet for 15 minutes, 3-4 times a day. Prenatal care   Write down your questions. Take them to your prenatal visits.  Keep all your prenatal visits as told by your health care provider. This is important. Safety   Wear your seat belt at all times when driving.  Make a list of emergency phone numbers, including numbers for family, friends, the hospital, and police and fire departments. General instructions   Avoid cat litter boxes and soil used by cats. These carry germs that can cause birth defects in the baby. If you have a cat, ask someone to clean the litter box for you.  Do not travel far distances unless it is absolutely necessary and only with the approval of your health care provider.  Do not use hot tubs, steam rooms, or saunas.  Do not drink alcohol.  Do not use any products that contain nicotine or tobacco, such as cigarettes and e-cigarettes. If you need help quitting, ask your health care provider.  Do not use any medicinal herbs or unprescribed drugs. These chemicals affect the formation and growth of the baby.  Do not douche or use tampons or scented sanitary pads.  Do not cross your legs for long periods of time.  To prepare for the arrival of your baby:  Take prenatal classes to understand, practice, and ask questions about labor and delivery.  Make a trial run to the hospital.  Visit the hospital and tour the maternity area.  Arrange for maternity or paternity leave through employers.  Arrange for family and friends to take care of pets while you are in the hospital.  Purchase a rear-facing car seat and make sure you know how to install it in your car.  Pack your hospital bag.  Prepare the babys nursery. Make sure to remove all pillows and stuffed animals from  the baby's crib to prevent suffocation.  Visit your dentist if you have not gone during your pregnancy. Use a soft toothbrush to brush your teeth and be gentle when you floss. Contact a health care provider if:  You are unsure if you are in labor or if your water has broken.  You  become dizzy.  You have mild pelvic cramps, pelvic pressure, or nagging pain in your abdominal area.  You have lower back pain.  You have persistent nausea, vomiting, or diarrhea.  You have an unusual or bad smelling vaginal discharge.  You have pain when you urinate. Get help right away if:  Your water breaks before 37 weeks.  You have regular contractions less than 5 minutes apart before 37 weeks.  You have a fever.  You are leaking fluid from your vagina.  You have spotting or bleeding from your vagina.  You have severe abdominal pain or cramping.  You have rapid weight loss or weight gain.  You have shortness of breath with chest pain.  You notice sudden or extreme swelling of your face, hands, ankles, feet, or legs.  Your baby makes fewer than 10 movements in 2 hours.  You have severe headaches that do not go away when you take medicine.  You have vision changes. Summary  The third trimester is from week 28 through week 40, months 7 through 9. The third trimester is a time when the unborn baby (fetus) is growing rapidly.  During the third trimester, your discomfort may increase as you and your baby continue to gain weight. You may have abdominal, leg, and back pain, sleeping problems, and an increased need to urinate.  During the third trimester your breasts will keep growing and they will continue to become tender. A yellow fluid (colostrum) may leak from your breasts. This is the first milk you are producing for your baby.  False labor is a condition in which you feel small, irregular tightenings of the muscles in the womb (contractions) that eventually go away. These are called  Braxton Hicks contractions. Contractions may last for hours, days, or even weeks before true labor sets in.  Signs of labor can include: abdominal cramps; regular contractions that start at 10 minutes apart and become stronger and more frequent with time; watery or bloody mucus discharge that comes from the vagina; increased pelvic pressure and dull back pain; and leaking of amniotic fluid. This information is not intended to replace advice given to you by your health care provider. Make sure you discuss any questions you have with your health care provider. Document Released: 12/08/2001 Document Revised: 05/21/2016 Document Reviewed: 02/14/2013 Elsevier Interactive Patient Education  2017 [employer] Fetal Movement Counts Patient Name: ________________________________________________ Patient Due Date: ____________________ What is a fetal movement count? A fetal movement count is the number of times that you feel your baby move during a certain amount of time. This may also be called a fetal kick count. A fetal movement count is recommended for every pregnant woman. You may be asked to start counting fetal movements as early as week 28 of your pregnancy. Pay attention to when your baby is most active. You may notice your baby's sleep and wake cycles. You may also notice things that make your baby move more. You should do a fetal movement count:  When your baby is normally most active.  At the same time each day. A good time to count movements is while you are resting, after having something to eat and drink. How do I count fetal movements? 1. Find a quiet, comfortable area. Sit, or lie down on your side. 2. Write down the date, the start time and stop time, and the number of movements that you felt between those two times. Take this information with you to your health care visits. 3. For  2 hours, count kicks, flutters, swishes, rolls, and jabs. You should feel at least 10 movements during 2  hours. 4. You may stop counting after you have felt 10 movements. 5. If you do not feel 10 movements in 2 hours, have something to eat and drink. Then, keep resting and counting for 1 hour. If you feel at least 4 movements during that hour, you may stop counting. Contact a health care provider if:  You feel fewer than 4 movements in 2 hours.  Your baby is not moving like he or she usually does. Date: ____________ Start time: ____________ Stop time: ____________ Movements: ____________ Date: ____________ Start time: ____________ Stop time: ____________ Movements: ____________ Date: ____________ Start time: ____________ Stop time: ____________ Movements: ____________ Date: ____________ Start time: ____________ Stop time: ____________ Movements: ____________ Date: ____________ Start time: ____________ Stop time: ____________ Movements: ____________ Date: ____________ Start time: ____________ Stop time: ____________ Movements: ____________ Date: ____________ Start time: ____________ Stop time: ____________ Movements: ____________ Date: ____________ Start time: ____________ Stop time: ____________ Movements: ____________ Date: ____________ Start time: ____________ Stop time: ____________ Movements: ____________ This information is not intended to replace advice given to you by your health care provider. Make sure you discuss any questions you have with your health care provider. Document Released: 01/13/2007 Document Revised: 08/12/2016 Document Reviewed: 01/23/2016 Elsevier Interactive Patient Education  2017 [employer].

## 2017-04-23 ENCOUNTER — Inpatient Hospital Stay (HOSPITAL_COMMUNITY)
Admission: AD | Admit: 2017-04-23 | Discharge: 2017-04-23 | Disposition: A | Discharge status: Home / Self Care | Payer: Medicaid Other | Source: Ambulatory Visit | Attending: Obstetrics & Gynecology | Admitting: Obstetrics & Gynecology

## 2017-04-23 ENCOUNTER — Encounter (HOSPITAL_COMMUNITY): Payer: Self-pay | Admitting: *Deleted

## 2017-04-23 DIAGNOSIS — O479 False labor, unspecified: Secondary | ICD-10-CM

## 2017-04-23 DIAGNOSIS — O99013 Anemia complicating pregnancy, third trimester: Secondary | ICD-10-CM

## 2017-04-23 NOTE — Discharge Instructions (Signed)
Third Trimester of Pregnancy °The third trimester is from week 28 through week 40 (months 7 through 9). The third trimester is a time when the unborn baby (fetus) is growing rapidly. At the end of the ninth month, the fetus is about 20 inches in length and weighs 6-10 pounds. °Body changes during your third trimester °Your body will continue to go through many changes during pregnancy. The changes vary from woman to woman. During the third trimester: °· Your weight will continue to increase. You can expect to gain 25-35 pounds (11-16 kg) by the end of the pregnancy. °· You may begin to get stretch marks on your hips, abdomen, and breasts. °· You may urinate more often because the fetus is moving lower into your pelvis and pressing on your bladder. °· You may develop or continue to have heartburn. This is caused by increased hormones that slow down muscles in the digestive tract. °· You may develop or continue to have constipation because increased hormones slow digestion and cause the muscles that push waste through your intestines to relax. °· You may develop hemorrhoids. These are swollen veins (varicose veins) in the rectum that can itch or be painful. °· You may develop swollen, bulging veins (varicose veins) in your legs. °· You may have increased body aches in the pelvis, back, or thighs. This is due to weight gain and increased hormones that are relaxing your joints. °· You may have changes in your hair. These can include thickening of your hair, rapid growth, and changes in texture. Some women also have hair loss during or after pregnancy, or hair that feels dry or thin. Your hair will most likely return to normal after your baby is born. °· Your breasts will continue to grow and they will continue to become tender. A yellow fluid (colostrum) may leak from your breasts. This is the first milk you are producing for your baby. °· Your belly button may stick out. °· You may notice more swelling in your hands,  face, or ankles. °· You may have increased tingling or numbness in your hands, arms, and legs. The skin on your belly may also feel numb. °· You may feel short of breath because of your expanding uterus. °· You may have more problems sleeping. This can be caused by the size of your belly, increased need to urinate, and an increase in your body's metabolism. °· You may notice the fetus "dropping," or moving lower in your abdomen (lightening). °· You may have increased vaginal discharge. °· You may notice your joints feel loose and you may have pain around your pelvic bone. °What to expect at prenatal visits °You will have prenatal exams every 2 weeks until week 36. Then you will have weekly prenatal exams. During a routine prenatal visit: °· You will be weighed to make sure you and the baby are growing normally. °· Your blood pressure will be taken. °· Your abdomen will be measured to track your baby's growth. °· The fetal heartbeat will be listened to. °· Any test results from the previous visit will be discussed. °· You may have a cervical check near your due date to see if your cervix has softened or thinned (effaced). °· You will be tested for Group B streptococcus. This happens between 35 and 37 weeks. °Your health care provider may ask you: °· What your birth plan is. °· How you are feeling. °· If you are feeling the baby move. °· If you have had any abnormal   symptoms, such as leaking fluid, bleeding, severe headaches, or abdominal cramping. °· If you are using any tobacco products, including cigarettes, chewing tobacco, and electronic cigarettes. °· If you have any questions. °Other tests or screenings that may be performed during your third trimester include: °· Blood tests that check for low iron levels (anemia). °· Fetal testing to check the health, activity level, and growth of the fetus. Testing is done if you have certain medical conditions or if there are problems during the pregnancy. °· Nonstress test  (NST). This test checks the health of your baby to make sure there are no signs of problems, such as the baby not getting enough oxygen. During this test, a belt is placed around your belly. The baby is made to move, and its heart rate is monitored during movement. °What is false labor? °False labor is a condition in which you feel small, irregular tightenings of the muscles in the womb (contractions) that usually go away with rest, changing position, or drinking water. These are called Braxton Hicks contractions. Contractions may last for hours, days, or even weeks before true labor sets in. If contractions come at regular intervals, become more frequent, increase in intensity, or become painful, you should see your health care provider. °What are the signs of labor? °· Abdominal cramps. °· Regular contractions that start at 10 minutes apart and become stronger and more frequent with time. °· Contractions that start on the top of the uterus and spread down to the lower abdomen and back. °· Increased pelvic pressure and dull back pain. °· A watery or bloody mucus discharge that comes from the vagina. °· Leaking of amniotic fluid. This is also known as your "water breaking." It could be a slow trickle or a gush. Let your health care provider know if it has a color or strange odor. °If you have any of these signs, call your health care provider right away, even if it is before your due date. °Follow these instructions at home: °Medicines  °· Follow your health care provider's instructions regarding medicine use. Specific medicines may be either safe or unsafe to take during pregnancy. °· Take a prenatal vitamin that contains at least 600 micrograms (mcg) of folic acid. °· If you develop constipation, try taking a stool softener if your health care provider approves. °Eating and drinking  °· Eat a balanced diet that includes fresh fruits and vegetables, whole grains, good sources of protein such as meat, eggs, or tofu,  and low-fat dairy. Your health care provider will help you determine the amount of weight gain that is right for you. °· Avoid raw meat and uncooked cheese. These carry germs that can cause birth defects in the baby. °· If you have low calcium intake from food, talk to your health care provider about whether you should take a daily calcium supplement. °· Eat four or five small meals rather than three large meals a day. °· Limit foods that are high in fat and processed sugars, such as fried and sweet foods. °· To prevent constipation: °¨ Drink enough fluid to keep your urine clear or pale yellow. °¨ Eat foods that are high in fiber, such as fresh fruits and vegetables, whole grains, and beans. °Activity  °· Exercise only as directed by your health care provider. Most women can continue their usual exercise routine during pregnancy. Try to exercise for 30 minutes at least 5 days a week. Stop exercising if you experience uterine contractions. °· Avoid heavy lifting. °·   Do not exercise in extreme heat or humidity, or at high altitudes. °· Wear low-heel, comfortable shoes. °· Practice good posture. °· You may continue to have sex unless your health care provider tells you otherwise. °Relieving pain and discomfort  °· Take frequent breaks and rest with your legs elevated if you have leg cramps or low back pain. °· Take warm sitz baths to soothe any pain or discomfort caused by hemorrhoids. Use hemorrhoid cream if your health care provider approves. °· Wear a good support bra to prevent discomfort from breast tenderness. °· If you develop varicose veins: °¨ Wear support pantyhose or compression stockings as told by your healthcare provider. °¨ Elevate your feet for 15 minutes, 3-4 times a day. °Prenatal care  °· Write down your questions. Take them to your prenatal visits. °· Keep all your prenatal visits as told by your health care provider. This is important. °Safety  °· Wear your seat belt at all times when  driving. °· Make a list of emergency phone numbers, including numbers for family, friends, the hospital, and police and fire departments. °General instructions  °· Avoid cat litter boxes and soil used by cats. These carry germs that can cause birth defects in the baby. If you have a cat, ask someone to clean the litter box for you. °· Do not travel far distances unless it is absolutely necessary and only with the approval of your health care provider. °· Do not use hot tubs, steam rooms, or saunas. °· Do not drink alcohol. °· Do not use any products that contain nicotine or tobacco, such as cigarettes and e-cigarettes. If you need help quitting, ask your health care provider. °· Do not use any medicinal herbs or unprescribed drugs. These chemicals affect the formation and growth of the baby. °· Do not douche or use tampons or scented sanitary pads. °· Do not cross your legs for long periods of time. °· To prepare for the arrival of your baby: °¨ Take prenatal classes to understand, practice, and ask questions about labor and delivery. °¨ Make a trial run to the hospital. °¨ Visit the hospital and tour the maternity area. °¨ Arrange for maternity or paternity leave through employers. °¨ Arrange for family and friends to take care of pets while you are in the hospital. °¨ Purchase a rear-facing car seat and make sure you know how to install it in your car. °¨ Pack your hospital bag. °¨ Prepare the baby’s nursery. Make sure to remove all pillows and stuffed animals from the baby's crib to prevent suffocation. °· Visit your dentist if you have not gone during your pregnancy. Use a soft toothbrush to brush your teeth and be gentle when you floss. °Contact a health care provider if: °· You are unsure if you are in labor or if your water has broken. °· You become dizzy. °· You have mild pelvic cramps, pelvic pressure, or nagging pain in your abdominal area. °· You have lower back pain. °· You have persistent nausea,  vomiting, or diarrhea. °· You have an unusual or bad smelling vaginal discharge. °· You have pain when you urinate. °Get help right away if: °· Your water breaks before 37 weeks. °· You have regular contractions less than 5 minutes apart before 37 weeks. °· You have a fever. °· You are leaking fluid from your vagina. °· You have spotting or bleeding from your vagina. °· You have severe abdominal pain or cramping. °· You have rapid weight loss or   weight gain. °· You have shortness of breath with chest pain. °· You notice sudden or extreme swelling of your face, hands, ankles, feet, or legs. °· Your baby makes fewer than 10 movements in 2 hours. °· You have severe headaches that do not go away when you take medicine. °· You have vision changes. °Summary °· The third trimester is from week 28 through week 40, months 7 through 9. The third trimester is a time when the unborn baby (fetus) is growing rapidly. °· During the third trimester, your discomfort may increase as you and your baby continue to gain weight. You may have abdominal, leg, and back pain, sleeping problems, and an increased need to urinate. °· During the third trimester your breasts will keep growing and they will continue to become tender. A yellow fluid (colostrum) may leak from your breasts. This is the first milk you are producing for your baby. °· False labor is a condition in which you feel small, irregular tightenings of the muscles in the womb (contractions) that eventually go away. These are called Braxton Hicks contractions. Contractions may last for hours, days, or even weeks before true labor sets in. °· Signs of labor can include: abdominal cramps; regular contractions that start at 10 minutes apart and become stronger and more frequent with time; watery or bloody mucus discharge that comes from the vagina; increased pelvic pressure and dull back pain; and leaking of amniotic fluid. °This information is not intended to replace advice given  to you by your health care provider. Make sure you discuss any questions you have with your health care provider. °Document Released: 12/08/2001 Document Revised: 05/21/2016 Document Reviewed: 02/14/2013 °[employer] Interactive Patient Education © 2017 [employer] °Fetal Movement Counts °Patient Name: ________________________________________________ Patient Due Date: ____________________ °What is a fetal movement count? °A fetal movement count is the number of times that you feel your baby move during a certain amount of time. This may also be called a fetal kick count. A fetal movement count is recommended for every pregnant woman. You may be asked to start counting fetal movements as early as week 28 of your pregnancy. °Pay attention to when your baby is most active. You may notice your baby's sleep and wake cycles. You may also notice things that make your baby move more. You should do a fetal movement count: °· When your baby is normally most active. °· At the same time each day. °A good time to count movements is while you are resting, after having something to eat and drink. °How do I count fetal movements? °1. Find a quiet, comfortable area. Sit, or lie down on your side. °2. Write down the date, the start time and stop time, and the number of movements that you felt between those two times. Take this information with you to your health care visits. °3. For 2 hours, count kicks, flutters, swishes, rolls, and jabs. You should feel at least 10 movements during 2 hours. °4. You may stop counting after you have felt 10 movements. °5. If you do not feel 10 movements in 2 hours, have something to eat and drink. Then, keep resting and counting for 1 hour. If you feel at least 4 movements during that hour, you may stop counting. °Contact a health care provider if: °· You feel fewer than 4 movements in 2 hours. °· Your baby is not moving like he or she usually does. °Date: ____________ Start time: ____________ Stop  time: ____________ Movements: ____________ °Date: ____________   Start time: ____________ Stop time: ____________ Movements: ____________ °Date: ____________ Start time: ____________ Stop time: ____________ Movements: ____________ °Date: ____________ Start time: ____________ Stop time: ____________ Movements: ____________ °Date: ____________ Start time: ____________ Stop time: ____________ Movements: ____________ °Date: ____________ Start time: ____________ Stop time: ____________ Movements: ____________ °Date: ____________ Start time: ____________ Stop time: ____________ Movements: ____________ °Date: ____________ Start time: ____________ Stop time: ____________ Movements: ____________ °Date: ____________ Start time: ____________ Stop time: ____________ Movements: ____________ °This information is not intended to replace advice given to you by your health care provider. Make sure you discuss any questions you have with your health care provider. °Document Released: 01/13/2007 Document Revised: 08/12/2016 Document Reviewed: 01/23/2016 °[employer] Interactive Patient Education © 2017 [employer] °Braxton Hicks Contractions °Contractions of the uterus can occur throughout pregnancy, but they are not always a sign that you are in labor. You may have practice contractions called Braxton Hicks contractions. These false labor contractions are sometimes confused with true labor. °What are Braxton Hicks contractions? °Braxton Hicks contractions are tightening movements that occur in the muscles of the uterus before labor. Unlike true labor contractions, these contractions do not result in opening (dilation) and thinning of the cervix. Toward the end of pregnancy (32-34 weeks), Braxton Hicks contractions can happen more often and may become stronger. These contractions are sometimes difficult to tell apart from true labor because they can be very uncomfortable. You should not feel embarrassed if you go to the hospital with  false labor. °Sometimes, the only way to tell if you are in true labor is for your health care provider to look for changes in the cervix. The health care provider will do a physical exam and may monitor your contractions. If you are not in true labor, the exam should show that your cervix is not dilating and your water has not broken. °If there are no prenatal problems or other health problems associated with your pregnancy, it is completely safe for you to be sent home with false labor. You may continue to have Braxton Hicks contractions until you go into true labor. °How can I tell the difference between true labor and false labor? °· Differences °¨ False labor °¨ Contractions last 30-70 seconds.: Contractions are usually shorter and not as strong as true labor contractions. °¨ Contractions become very regular.: Contractions are usually irregular. °¨ Discomfort is usually felt in the top of the uterus, and it spreads to the lower abdomen and low back.: Contractions are often felt in the front of the lower abdomen and in the groin. °¨ Contractions do not go away with walking.: Contractions may go away when you walk around or change positions while lying down. °¨ Contractions usually become more intense and increase in frequency.: Contractions get weaker and are shorter-lasting as time goes on. °¨ The cervix dilates and gets thinner.: The cervix usually does not dilate or become thin. °Follow these instructions at home: °¨ Take over-the-counter and prescription medicines only as told by your health care provider. °¨ Keep up with your usual exercises and follow other instructions from your health care provider. °¨ Eat and drink lightly if you think you are going into labor. °¨ If Braxton Hicks contractions are making you uncomfortable: °¨ Change your position from lying down or resting to walking, or change from walking to resting. °¨ Sit and rest in a tub of warm water. °¨ Drink enough fluid to keep your urine  clear or pale yellow. Dehydration may cause these contractions. °¨ Do slow and   deep breathing several times an hour. °¨ Keep all follow-up prenatal visits as told by your health care provider. This is important. °Contact a health care provider if: °¨ You have a fever. °¨ You have continuous pain in your abdomen. °Get help right away if: °¨ Your contractions become stronger, more regular, and closer together. °¨ You have fluid leaking or gushing from your vagina. °¨ You pass blood-tinged mucus (bloody show). °¨ You have bleeding from your vagina. °¨ You have low back pain that you never had before. °¨ You feel your baby’s head pushing down and causing pelvic pressure. °¨ Your baby is not moving inside you as much as it used to. °Summary °¨ Contractions that occur before labor are called Braxton Hicks contractions, false labor, or practice contractions. °¨ Braxton Hicks contractions are usually shorter, weaker, farther apart, and less regular than true labor contractions. True labor contractions usually become progressively stronger and regular and they become more frequent. °¨ Manage discomfort from Braxton Hicks contractions by changing position, resting in a warm bath, drinking plenty of water, or practicing deep breathing. °This information is not intended to replace advice given to you by your health care provider. Make sure you discuss any questions you have with your health care provider. °Document Released: 12/14/2005 Document Revised: 11/02/2016 Document Reviewed: 11/02/2016 °[employer] Interactive Patient Education © 2017 [employer] ° °

## 2017-04-23 NOTE — MAU Note (Signed)
Pt reports contractions every 5 mins. Pt denies LOF or vag bleeding. +FM. 2cm on last exam.

## 2017-04-23 NOTE — MAU Note (Signed)
I have communicated with Cleone Slim, SCNM and reviewed vital signs:  Vitals:   04/23/17 0455 04/23/17 0702  BP: 130/87 133/75  Pulse: (!) 108 100  Resp: 20 18  Temp: 98.1 F (36.7 C)     Vaginal exam:  Dilation: 3 Effacement (%): 50 Cervical Position: Posterior Station: -2 Presentation: Vertex Exam by:: Errick Salts, RN ,   Also reviewed contraction pattern and that non-stress test is reactive.  It has been documented that patient is contracting every 2-5 minutes with minimal cervical change over 1.5 hours not indicating active labor.  Patient denies any other complaints.  Based on this report provider has given order for discharge.  A discharge order and diagnosis entered by a provider.   Labor discharge instructions reviewed with patient.

## 2017-04-24 ENCOUNTER — Encounter (HOSPITAL_COMMUNITY): Payer: Self-pay

## 2017-04-24 ENCOUNTER — Inpatient Hospital Stay (HOSPITAL_COMMUNITY)
Admission: AD | Admit: 2017-04-24 | Discharge: 2017-04-27 | DRG: 775 | Disposition: A | Discharge status: Home / Self Care | Payer: Medicaid Other | Source: Ambulatory Visit | Attending: Obstetrics & Gynecology | Admitting: Obstetrics & Gynecology

## 2017-04-24 DIAGNOSIS — O2686 Pruritic urticarial papules and plaques of pregnancy (PUPPP): Secondary | ICD-10-CM | POA: Diagnosis present

## 2017-04-24 DIAGNOSIS — Z3493 Encounter for supervision of normal pregnancy, unspecified, third trimester: Secondary | ICD-10-CM | POA: Diagnosis present

## 2017-04-24 DIAGNOSIS — D649 Anemia, unspecified: Secondary | ICD-10-CM | POA: Diagnosis present

## 2017-04-24 DIAGNOSIS — Z3A38 38 weeks gestation of pregnancy: Secondary | ICD-10-CM | POA: Diagnosis not present

## 2017-04-24 DIAGNOSIS — O9902 Anemia complicating childbirth: Secondary | ICD-10-CM | POA: Diagnosis present

## 2017-04-24 DIAGNOSIS — O99013 Anemia complicating pregnancy, third trimester: Secondary | ICD-10-CM

## 2017-04-24 LAB — CBC
HCT: 29 % — ABNORMAL LOW (ref 36.0–46.0)
HEMOGLOBIN: 9.2 g/dL — AB (ref 12.0–15.0)
MCH: 27.4 pg (ref 26.0–34.0)
MCHC: 31.7 g/dL (ref 30.0–36.0)
MCV: 86.3 fL (ref 78.0–100.0)
Platelets: 256 10*3/uL (ref 150–400)
RBC: 3.36 MIL/uL — ABNORMAL LOW (ref 3.87–5.11)
RDW: 14.7 % (ref 11.5–15.5)
WBC: 11.9 10*3/uL — AB (ref 4.0–10.5)

## 2017-04-24 LAB — TYPE AND SCREEN
ABO/RH(D): A NEG
ANTIBODY SCREEN: NEGATIVE

## 2017-04-24 LAB — ABO/RH: ABO/RH(D): A NEG

## 2017-04-24 MED ORDER — ACETAMINOPHEN 325 MG PO TABS: 650.0000 mg | ORAL_TABLET | ORAL | Status: DC | PRN

## 2017-04-24 MED ORDER — EPHEDRINE 5 MG/ML INJ
10.0000 mg | INTRAVENOUS | Status: DC | PRN
  Filled 2017-04-24: qty 2

## 2017-04-24 MED ORDER — DIPHENHYDRAMINE HCL 50 MG/ML IJ SOLN: 12.5000 mg | INTRAMUSCULAR | Status: DC | PRN

## 2017-04-24 MED ORDER — OXYTOCIN 40 UNITS IN LACTATED RINGERS INFUSION - SIMPLE MED
2.5000 [IU]/h | INTRAVENOUS | Status: DC
  Administered 2017-04-25: 2.5 [IU]/h via INTRAVENOUS
  Filled 2017-04-24: qty 1000

## 2017-04-24 MED ORDER — PHENYLEPHRINE 40 MCG/ML (10ML) SYRINGE FOR IV PUSH (FOR BLOOD PRESSURE SUPPORT)
80.0000 ug | PREFILLED_SYRINGE | INTRAVENOUS | Status: DC | PRN
  Filled 2017-04-24: qty 10
  Filled 2017-04-24: qty 5

## 2017-04-24 MED ORDER — FENTANYL CITRATE (PF) 100 MCG/2ML IJ SOLN
100.0000 ug | INTRAMUSCULAR | Status: DC | PRN
  Administered 2017-04-25 (×2): 100 ug via INTRAVENOUS
  Filled 2017-04-24 (×2): qty 2

## 2017-04-24 MED ORDER — ONDANSETRON HCL 4 MG/2ML IJ SOLN: 4.0000 mg | Freq: Four times a day (QID) | INTRAMUSCULAR | Status: DC | PRN

## 2017-04-24 MED ORDER — FENTANYL 2.5 MCG/ML BUPIVACAINE 1/10 % EPIDURAL INFUSION (WH - ANES)
14.0000 mL/h | INTRAMUSCULAR | Status: DC | PRN
  Administered 2017-04-25 (×2): 14 mL/h via EPIDURAL
  Filled 2017-04-24 (×2): qty 100

## 2017-04-24 MED ORDER — FLEET ENEMA 7-19 GM/118ML RE ENEM: 1.0000 | ENEMA | RECTAL | Status: DC | PRN

## 2017-04-24 MED ORDER — LACTATED RINGERS IV BOLUS (SEPSIS)
1000.0000 mL | Freq: Once | INTRAVENOUS | Status: AC
  Administered 2017-04-24: 1000 mL via INTRAVENOUS

## 2017-04-24 MED ORDER — OXYCODONE-ACETAMINOPHEN 5-325 MG PO TABS: 1.0000 | ORAL_TABLET | ORAL | Status: DC | PRN

## 2017-04-24 MED ORDER — LIDOCAINE HCL (PF) 1 % IJ SOLN
30.0000 mL | INTRAMUSCULAR | Status: DC | PRN
  Filled 2017-04-24: qty 30

## 2017-04-24 MED ORDER — PHENYLEPHRINE 40 MCG/ML (10ML) SYRINGE FOR IV PUSH (FOR BLOOD PRESSURE SUPPORT)
80.0000 ug | PREFILLED_SYRINGE | INTRAVENOUS | Status: DC | PRN
  Filled 2017-04-24: qty 5

## 2017-04-24 MED ORDER — LACTATED RINGERS IV SOLN: 500.0000 mL | INTRAVENOUS | Status: DC | PRN

## 2017-04-24 MED ORDER — OXYTOCIN BOLUS FROM INFUSION
500.0000 mL | Freq: Once | INTRAVENOUS | Status: AC
  Administered 2017-04-25: 500 mL via INTRAVENOUS

## 2017-04-24 MED ORDER — LACTATED RINGERS IV SOLN
500.0000 mL | Freq: Once | INTRAVENOUS | Status: AC
  Administered 2017-04-25: 500 mL via INTRAVENOUS

## 2017-04-24 MED ORDER — LACTATED RINGERS IV SOLN
INTRAVENOUS | Status: DC
  Administered 2017-04-25: 03:00:00 via INTRAVENOUS

## 2017-04-24 MED ORDER — OXYCODONE-ACETAMINOPHEN 5-325 MG PO TABS: 2.0000 | ORAL_TABLET | ORAL | Status: DC | PRN

## 2017-04-24 MED ORDER — SOD CITRATE-CITRIC ACID 500-334 MG/5ML PO SOLN: 30.0000 mL | ORAL | Status: DC | PRN

## 2017-04-24 NOTE — MAU Note (Signed)
Have had ctxs last couple days that get stronger and then space out intermittently. Walking earlier tonight and came in and felt dizzy. Has been drinking well and has eaten. Cont to have some dizziness. Denies bleeding or LOF. 3cm last sve

## 2017-04-24 NOTE — MAU Note (Signed)
Urine sent to lab 

## 2017-04-24 NOTE — Progress Notes (Signed)
g1 @ 38.[redacted] wksga. Presents to triage for ctx. Denies LOF or bleeding. EFM applied.   SVE: 5.5/80/-2

## 2017-04-24 NOTE — H&P (Signed)
LABOR AND DELIVERY ADMISSION HISTORY AND PHYSICAL NOTE  Patricia Rich is a 21 y.o. female G1P0 with IUP at [redacted]w[redacted]d by LMP consistent with anatomy scan presenting for SOL.   She reports positive fetal movement. She denies leakage of fluid or vaginal bleeding.  Prenatal History/Complications:  Past Medical History: Past Medical History:  Diagnosis Date  . Anemia     Past Surgical History: Past Surgical History:  Procedure Laterality Date  . EYE SURGERY      Obstetrical History: OB History    Gravida Para Term Preterm AB Living   1             SAB TAB Ectopic Multiple Live Births                  Social History: Social History   Social History  . Marital status: Single    Spouse name: N/A  . Number of children: 0  . Years of education: N/A   Social History Main Topics  . Smoking status: Never Smoker  . Smokeless tobacco: Never Used  . Alcohol use No  . Drug use: No  . Sexual activity: Yes    Birth control/ protection: None   Other Topics Concern  . None   Social History Narrative  . None    Family History: History reviewed. No pertinent family history.  Allergies: No Known Allergies  Prescriptions Prior to Admission  Medication Sig Dispense Refill Last Dose  . ferrous sulfate 325 (65 FE) MG tablet Take 325 mg by mouth daily with breakfast.   04/24/2017 at Unknown time  . hydrOXYzine (VISTARIL) 25 MG capsule Take 1 capsule (25 mg total) by mouth 3 (three) times daily as needed. (Patient taking differently: Take 25 mg by mouth 3 (three) times daily as needed for itching. ) 30 capsule 0 04/24/2017 at Unknown time  . Prenatal Vit-Fe Fumarate-FA (PRENATAL VITAMINS) 28-0.8 MG TABS Take 1 tablet by mouth daily.    04/24/2017 at Unknown time  . triamcinolone ointment (KENALOG) 0.5 % Apply 1 application topically 3 (three) times daily as needed. Apply to the affected area of skin, avoid the face. 45 g 1 04/24/2017 at Unknown time  . predniSONE (DELTASONE) 5 MG  tablet Take 5 mg by mouth 3 (three) times daily. Started 04/15/17 for 5 days.   04/20/2017     Review of Systems   All systems reviewed and negative except as stated in HPI  Blood pressure 123/88, pulse (!) 107, temperature 98.1 F (36.7 C), resp. rate 16, height  (1.6 m), weight 198 lb 8 oz (90 kg), last menstrual period 05/31/2016. General appearance: alert, cooperative and appears stated age Lungs: clear to auscultation bilaterally Heart: regular rate and rhythm Abdomen: soft, non-tender; bowel sounds normal Extremities: No calf swelling or tenderness Presentation: cephalic by nursing exam Fetal monitoring: 140/mod var/reactive Uterine activity: contractions q 8-10 Dilation: 5.5 Effacement (%): 80 Station: -2 Exam by:: Naomie Dean, RN   Prenatal labs: ABO, Rh: --/--/A NEG (04/28 2248) Antibody: NEG (04/28 2248) Rubella: immune RPR: Non Reactive (02/22 0824)  HBsAg: NEGATIVE (11/28 0001)  HIV: Non Reactive (02/22 0824)  GBS:   negative  2 hr Glucola: normal Genetic screening:  Not preformed Anatomy US: normal  Prenatal Transfer Tool  Maternal Diabetes: No Genetic Screening: Declined Maternal Ultrasounds/Referrals: Normal Fetal Ultrasounds or other Referrals:  None Maternal Substance Abuse:  No Significant Maternal Medications:  None Significant Maternal Lab Results: Lab values include: Group B Strep negative  Results  for orders placed or performed during the hospital encounter of 04/24/17 (from the past 24 hour(s))  CBC   Collection Time: 04/24/17 10:48 PM  Result Value Ref Range   WBC 11.9 (H) 4.0 - 10.5 K/uL   RBC 3.36 (L) 3.87 - 5.11 MIL/uL   Hemoglobin 9.2 (L) 12.0 - 15.0 g/dL   HCT 96.0 (L) 45.4 - 09.8 %   MCV 86.3 78.0 - 100.0 fL   MCH 27.4 26.0 - 34.0 pg   MCHC 31.7 30.0 - 36.0 g/dL   RDW 11.9 14.7 - 82.9 %   Platelets 256 150 - 400 K/uL  Type and screen [facility]   Collection Time: 04/24/17 10:48 PM  Result Value Ref  Range   ABO/RH(D) A NEG    Antibody Screen NEG    Sample Expiration 04/27/2017     Patient Active Problem List   Diagnosis Date Noted  . Indication for care in labor or delivery 04/24/2017  . PUPP (pruritic urticarial papules and plaques of pregnancy) 04/15/2017  . Anemia affecting pregnancy 02/20/2017  . Supervision of normal first pregnancy 11/24/2016  . Limited prenatal care in first trimester 11/24/2016    Assessment: Patricia Rich is a 21 y.o. G1P0 at [redacted]w[redacted]d here for SOL  #Labor:expectant management at this time.  #Pain: Considering epidrual - IV pain medication at this time #FWB: Category 1 #ID:  GBS negative #MOF: breast #MOC:IUD #Circ:  Yes possible inpatient.   Ernestina Penna 04/24/2017, 11:52 PM

## 2017-04-25 ENCOUNTER — Encounter (HOSPITAL_COMMUNITY): Payer: Self-pay

## 2017-04-25 ENCOUNTER — Inpatient Hospital Stay (HOSPITAL_COMMUNITY): Payer: Medicaid Other | Admitting: Anesthesiology

## 2017-04-25 DIAGNOSIS — Z3A38 38 weeks gestation of pregnancy: Secondary | ICD-10-CM

## 2017-04-25 LAB — RPR: RPR Ser Ql: NONREACTIVE

## 2017-04-25 MED ORDER — SODIUM CHLORIDE 0.9% FLUSH: 3.0000 mL | INTRAVENOUS | Status: DC | PRN

## 2017-04-25 MED ORDER — BENZOCAINE-MENTHOL 20-0.5 % EX AERO
1.0000 "application " | INHALATION_SPRAY | CUTANEOUS | Status: DC | PRN
  Administered 2017-04-25: 1 via TOPICAL
  Filled 2017-04-25: qty 56

## 2017-04-25 MED ORDER — LACTATED RINGERS IV SOLN: 500.0000 mL | Freq: Once | INTRAVENOUS | Status: DC

## 2017-04-25 MED ORDER — WITCH HAZEL-GLYCERIN EX PADS
1.0000 | MEDICATED_PAD | CUTANEOUS | Status: DC | PRN
Start: 2017-04-25 — End: 2017-04-27

## 2017-04-25 MED ORDER — LIDOCAINE HCL (PF) 1 % IJ SOLN
INTRAMUSCULAR | Status: DC | PRN
  Administered 2017-04-25: 4 mL via EPIDURAL

## 2017-04-25 MED ORDER — TETANUS-DIPHTH-ACELL PERTUSSIS 5-2.5-18.5 LF-MCG/0.5 IM SUSP: 0.5000 mL | Freq: Once | INTRAMUSCULAR | Status: DC

## 2017-04-25 MED ORDER — IBUPROFEN 600 MG PO TABS
600.0000 mg | ORAL_TABLET | Freq: Four times a day (QID) | ORAL | Status: DC
  Administered 2017-04-25 – 2017-04-27 (×6): 600 mg via ORAL
  Filled 2017-04-25 (×8): qty 1

## 2017-04-25 MED ORDER — SENNOSIDES-DOCUSATE SODIUM 8.6-50 MG PO TABS
2.0000 | ORAL_TABLET | ORAL | Status: DC
  Filled 2017-04-25 (×2): qty 2

## 2017-04-25 MED ORDER — DIBUCAINE 1 % RE OINT: 1.0000 "application " | TOPICAL_OINTMENT | RECTAL | Status: DC | PRN

## 2017-04-25 MED ORDER — ACETAMINOPHEN 325 MG PO TABS: 650.0000 mg | ORAL_TABLET | ORAL | Status: DC | PRN

## 2017-04-25 MED ORDER — OXYTOCIN 40 UNITS IN LACTATED RINGERS INFUSION - SIMPLE MED
4.0000 m[IU]/min | INTRAVENOUS | Status: DC
  Administered 2017-04-25: 4 m[IU]/min via INTRAVENOUS

## 2017-04-25 MED ORDER — DIPHENHYDRAMINE HCL 25 MG PO CAPS: 25.0000 mg | ORAL_CAPSULE | Freq: Four times a day (QID) | ORAL | Status: DC | PRN

## 2017-04-25 MED ORDER — ZOLPIDEM TARTRATE 5 MG PO TABS: 5.0000 mg | ORAL_TABLET | Freq: Every evening | ORAL | Status: DC | PRN

## 2017-04-25 MED ORDER — PHENYLEPHRINE 40 MCG/ML (10ML) SYRINGE FOR IV PUSH (FOR BLOOD PRESSURE SUPPORT): 80.0000 ug | PREFILLED_SYRINGE | INTRAVENOUS | Status: DC | PRN

## 2017-04-25 MED ORDER — SODIUM CHLORIDE 0.9% FLUSH: 3.0000 mL | Freq: Two times a day (BID) | INTRAVENOUS | Status: DC

## 2017-04-25 MED ORDER — BUPIVACAINE HCL (PF) 0.25 % IJ SOLN
INTRAMUSCULAR | Status: DC | PRN
  Administered 2017-04-25 (×2): 5 mL via EPIDURAL

## 2017-04-25 MED ORDER — SIMETHICONE 80 MG PO CHEW: 80.0000 mg | CHEWABLE_TABLET | ORAL | Status: DC | PRN

## 2017-04-25 MED ORDER — SODIUM CHLORIDE 0.9 % IV SOLN: 250.0000 mL | INTRAVENOUS | Status: DC | PRN

## 2017-04-25 MED ORDER — PRENATAL MULTIVITAMIN CH
1.0000 | ORAL_TABLET | Freq: Every day | ORAL | Status: DC
  Administered 2017-04-26 – 2017-04-27 (×2): 1 via ORAL
  Filled 2017-04-25 (×2): qty 1

## 2017-04-25 MED ORDER — ONDANSETRON HCL 4 MG PO TABS: 4.0000 mg | ORAL_TABLET | ORAL | Status: DC | PRN

## 2017-04-25 MED ORDER — MEASLES, MUMPS & RUBELLA VAC ~~LOC~~ INJ: 0.5000 mL | INJECTION | Freq: Once | SUBCUTANEOUS | Status: DC

## 2017-04-25 MED ORDER — FENTANYL CITRATE (PF) 100 MCG/2ML IJ SOLN
INTRAMUSCULAR | Status: DC | PRN
  Administered 2017-04-25 (×2): 50 ug via EPIDURAL

## 2017-04-25 MED ORDER — ONDANSETRON HCL 4 MG/2ML IJ SOLN: 4.0000 mg | INTRAMUSCULAR | Status: DC | PRN

## 2017-04-25 MED ORDER — COCONUT OIL OIL: 1.0000 "application " | TOPICAL_OIL | Status: DC | PRN

## 2017-04-25 MED ORDER — EPHEDRINE 5 MG/ML INJ
10.0000 mg | INTRAVENOUS | Status: DC | PRN
Start: 2017-04-25 — End: 2017-04-25

## 2017-04-25 NOTE — Anesthesia Preprocedure Evaluation (Signed)
Anesthesia Evaluation  Patient identified by MRN, date of birth, ID band Patient awake    Reviewed: Allergy & Precautions, Patient's Chart, lab work & pertinent test results  Airway Mallampati: II       Dental  (+) Teeth Intact   Pulmonary neg pulmonary ROS,    breath sounds clear to auscultation       Cardiovascular negative cardio ROS   Rhythm:Regular Rate:Normal     Neuro/Psych negative neurological ROS  negative psych ROS   GI/Hepatic negative GI ROS, Neg liver ROS,   Endo/Other  negative endocrine ROS  Renal/GU negative Renal ROS  negative genitourinary   Musculoskeletal negative musculoskeletal ROS (+)   Abdominal   Peds negative pediatric ROS (+)  Hematology negative hematology ROS (+)   Anesthesia Other Findings   Reproductive/Obstetrics (+) Pregnancy                             Lab Results  Component Value Date   WBC 11.9 (H) 04/24/2017   HGB 9.2 (L) 04/24/2017   HCT 29.0 (L) 04/24/2017   MCV 86.3 04/24/2017   PLT 256 04/24/2017     Anesthesia Physical Anesthesia Plan  ASA: II  Anesthesia Plan: Epidural   Post-op Pain Management:    Induction:   Airway Management Planned:   Additional Equipment:   Intra-op Plan:   Post-operative Plan:   Informed Consent: I have reviewed the patients History and Physical, chart, labs and discussed the procedure including the risks, benefits and alternatives for the proposed anesthesia with the patient or authorized representative who has indicated his/her understanding and acceptance.     Plan Discussed with:   Anesthesia Plan Comments:         Anesthesia Quick Evaluation

## 2017-04-25 NOTE — Anesthesia Pain Management Evaluation Note (Signed)
  CRNA Pain Management Visit Note  Patient: Patricia Rich, 21 y.o., female  "Hello I am a member of the anesthesia team at [facility]. We have an anesthesia team available at all times to provide care throughout the hospital, including epidural management and anesthesia for C-section. I don't know your plan for the delivery whether it a natural birth, water birth, IV sedation, nitrous supplementation, doula or epidural, but we want to meet your pain goals."   1.Was your pain managed to your expectations on prior hospitalizations?   No prior hospitalizations  2.What is your expectation for pain management during this hospitalization?     Epidural  3.How can we help you reach that goal? Unsure   Record the patient's initial score and the patient's pain goal.   Pain: 2  Pain Goal: 7 [facility] wants you to be able to say your pain was always managed very well.  Cephus Shelling 04/25/2017

## 2017-04-25 NOTE — Progress Notes (Signed)
Subjective: Patient reporting more pain, especially in back. Increased pressure.   Objective: Vitals:   04/25/17 0601 04/25/17 0631 04/25/17 0700 04/25/17 0723  BP: 117/75 135/79 130/84 (!) 132/92  Pulse: (!) 102 97 (!) 101 100  Resp:    18  Temp:    98.4 F (36.9 C)  TempSrc:    Axillary  Weight:    198 lb (89.8 kg)  Height:     (1.6 m)    FHT:  FHR: 120 bpm, variability: moderate,  accelerations:  Present,  decelerations:  Present variable UC:   regular, every 2-3 minutes SVE:   Dilation: Lip/rim Effacement (%): 100 Station: +1 Exam by:: caroline neill cnm student  Assessment / Plan: IUP at term. Spontaneous onset of labor.   Plan: FSE placed due to variable decelerations. Manage expectantly. Anticipate NSVD  Cleone Slim [facility] 04/25/2017, 9:19 AM    \

## 2017-04-25 NOTE — Lactation Note (Signed)
This note was copied from a baby's chart. Lactation Consultation Note  Patient Name: Patricia Rich WUJWJ'X Date: 04/25/2017 Reason for consult: Initial assessment  Initial visit at 10 hours of life. During consult, "Fayrene Fearing" had his first real feeding (Mom reports 2-3 additional attempts). Mom was comfortable w/the latch & she is able to identify the sound of swallows. Mom reports + breast changes w/pregnancy & she reports having leaked for the last 2 months. Mom has copious amounts of colostrum.   Mom made aware of O/P services, breastfeeding support groups, community resources, and our phone # for post-discharge questions.   Lurline Hare [facility] 04/25/2017, 10:43 PM

## 2017-04-25 NOTE — Anesthesia Postprocedure Evaluation (Signed)
Anesthesia Post Note  Patient: Patricia Rich  Procedure(s) Performed: * No procedures listed *  Patient location during evaluation: Mother Baby Anesthesia Type: Epidural Level of consciousness: awake and alert Pain management: pain level controlled Vital Signs Assessment: post-procedure vital signs reviewed and stable Respiratory status: spontaneous breathing, nonlabored ventilation and respiratory function stable Cardiovascular status: stable Postop Assessment: no headache, no backache, epidural receding and no signs of nausea or vomiting Anesthetic complications: no        Last Vitals:  Vitals:   04/25/17 1435 04/25/17 1540  BP: 138/84 138/82  Pulse: (!) 103 (!) 109  Resp: 16 18  Temp: 36.7 C 36.4 C    Last Pain:  Vitals:   04/25/17 1540  TempSrc: Oral  PainSc: 2    Pain Goal: Patients Stated Pain Goal: 2 (04/25/17 1540)               Rica Records

## 2017-04-25 NOTE — Anesthesia Procedure Notes (Signed)
Epidural Patient location during procedure: OB Start time: 04/25/2017 2:39 AM End time: 04/25/2017 2:48 AM  Staffing Anesthesiologist: Shona Simpson D Performed: anesthesiologist   Preanesthetic Checklist Completed: patient identified, site marked, surgical consent, pre-op evaluation, timeout performed, IV checked, risks and benefits discussed and monitors and equipment checked  Epidural Patient position: sitting Prep: ChloraPrep Patient monitoring: heart rate, continuous pulse ox and blood pressure Approach: midline Location: L3-L4 Injection technique: LOR saline  Needle:  Needle type: Tuohy  Needle gauge: 17 G Needle length: 9 cm Catheter type: closed end flexible Catheter size: 20 Guage Test dose: negative and 1.5% lidocaine  Assessment Events: blood not aspirated, injection not painful, no injection resistance and no paresthesia  Additional Notes LOR @ 6  Patient identified. Risks/Benefits/Options discussed with patient including but not limited to bleeding, infection, nerve damage, paralysis, failed block, incomplete pain control, headache, blood pressure changes, nausea, vomiting, reactions to medications, itching and postpartum back pain. Confirmed with bedside nurse the patient's most recent platelet count. Confirmed with patient that they are not currently taking any anticoagulation, have any bleeding history or any family history of bleeding disorders. Patient expressed understanding and wished to proceed. All questions were answered. Sterile technique was used throughout the entire procedure. Please see nursing notes for vital signs. Test dose was given through epidural catheter and negative prior to continuing to dose epidural or start infusion. Warning signs of high block given to the patient including shortness of breath, tingling/numbness in hands, complete motor block, or any concerning symptoms with instructions to call for help. Patient was given instructions on fall  risk and not to get out of bed. All questions and concerns addressed with instructions to call with any issues or inadequate analgesia.    Reason for block:procedure for pain

## 2017-04-25 NOTE — Progress Notes (Signed)
Patient seen and doing well. No complaints. Cervix 6/100/0. Continue to monitor. Attempt AROM  With no significant amount of fluid. Will continue to monitor and expect SVD.  Ernestina Penna, MD 04/25/17 5:32 AM

## 2017-04-26 NOTE — Progress Notes (Signed)
UR chart review completed.  

## 2017-04-26 NOTE — Lactation Note (Signed)
This note was copied from a baby's chart. Lactation Consultation Note  Patient Name: Patricia Rich'W Date: 04/26/2017 Reason for consult: Follow-up assessment   With this first time mom and term baby, now 84 hours old. Mom reports baby just fed, a little less than an hour ago. Mom had a long stretch of not feeding last night, and she said the baby would not latch due to spitting.  I reviewed hand expression with the mom, and explained that her milk should be transitioning in in the next 24 hours. I advised mom to keep baby skin to skin to increase number of feedings.,and pointed out that the little moving the baby was showing now would be a cue that he may be hungry. I explained that she could not overfeed a BF baby, but could underfeed him. Mom seemed to understand, and knows to call for questions/concerns.    Maternal Data    Feeding Feeding Type: Breast Fed Length of feed: 16 min  LATCH Score/Interventions Latch: Grasps breast easily, tongue down, lips flanged, rhythmical sucking.  Audible Swallowing: A few with stimulation  Type of Nipple: Everted at rest and after stimulation  Comfort (Breast/Nipple): Soft / non-tender     Hold (Positioning): No assistance needed to correctly position infant at breast.  LATCH Score: 9  Lactation Tools Discussed/Used     Consult Status Consult Status: Follow-up Date: 04/27/17 Follow-up type: In-patient    Alfred Levins 04/26/2017, 12:44 PM

## 2017-04-26 NOTE — Progress Notes (Signed)
Post Partum Day #1 Subjective: no complaints, up ad lib, voiding, tolerating PO and wants to pay for a circumcision and have it done in the hospital today  Objective: Blood pressure 132/75, pulse (!) 105, temperature 98.5 F (36.9 C), temperature source Oral, resp. rate 16, height  (1.6 m), weight 89.8 kg (198 lb), last menstrual period 05/31/2016, SpO2 99 %.  Physical Exam:  General: alert Lochia: appropriate Uterine Fundus: firm and NT at U DVT Evaluation: No evidence of DVT seen on physical exam.   Recent Labs  04/24/17 2248  HGB 9.2*  HCT 29.0*    Assessment/Plan: Plan for discharge tomorrow   LOS: 2 days   Allie Bossier 04/26/2017, 6:41 AM

## 2017-04-27 MED ORDER — NORETHINDRONE 0.35 MG PO TABS: 1.0000 | ORAL_TABLET | Freq: Every day | ORAL | 11 refills | Status: DC

## 2017-04-27 MED ORDER — IBUPROFEN 600 MG PO TABS: 600.0000 mg | ORAL_TABLET | Freq: Four times a day (QID) | ORAL | 0 refills | Status: DC

## 2017-04-27 NOTE — Discharge Instructions (Signed)

## 2017-04-27 NOTE — Lactation Note (Signed)
This note was copied from a baby's chart. Lactation Consultation Note; Mom attempting to latch in cross cradle hold in bed. Reports this in not comfortable. Suggested sitting up in chair. Mom agreeable.  Used football hold and baby latched well. Few swallows noted. Baby getting sleepy after 10 min. Burped him and latched to other breast. Mom reports no pain with latch.  Baby seems to have limited tongue movement- does not lift tongue or extend it. Baby for circ today. Reviewed normal behavior after circ.  Reviewed engorgement prevention and treatment. Has manual pump for home.  No questions at present. Reviewed our phone number, OP appointments and BFSG as resources for support after DC. To call prn  Patient Name: Patricia Rich WGNFA'O Date: 04/27/2017 Reason for consult: Follow-up assessment   Maternal Data Formula Feeding for Exclusion: No Has patient been taught Hand Expression?: Yes Does the patient have breastfeeding experience prior to this delivery?: No  Feeding Feeding Type: Breast Fed Length of feed: 25 min  LATCH Score/Interventions Latch: Grasps breast easily, tongue down, lips flanged, rhythmical sucking.  Audible Swallowing: A few with stimulation  Type of Nipple: Everted at rest and after stimulation  Comfort (Breast/Nipple): Soft / non-tender     Hold (Positioning): Assistance needed to correctly position infant at breast and maintain latch. Intervention(s): Breastfeeding basics reviewed  LATCH Score: 8  Lactation Tools Discussed/Used WIC Program: No   Consult Status Consult Status: Follow-up Date: 04/28/17 Follow-up type: In-patient    Pamelia Hoit 04/27/2017, 9:50 AM

## 2017-04-27 NOTE — Discharge Summary (Signed)
OB Discharge Summary  Patient Name: Patricia Rich DOB: [birth year] MRN: [id number]  Date of admission: 04/24/2017 Delivering MD: Willodean Rosenthal   Date of discharge: 04/27/2017  Admitting diagnosis: 38 WKS, LABOR, LIGHTHEADED Intrauterine pregnancy: [redacted]w[redacted]d     Secondary diagnosis:Active Problems:   Indication for care in labor or delivery  Additional problems:anemia     Discharge diagnosis: Term Pregnancy Delivered                                                                      Augmentation: AROM  Complications: None  Hospital course:  Onset of Labor With Vaginal Delivery     21 y.o. yo G1P0 at [redacted]w[redacted]d was admitted in Active Labor on 04/24/2017. Patient had an uncomplicated labor course as follows:  Membrane Rupture Time/Date: 5:27 AM ,04/25/2017   Intrapartum Procedures: Episiotomy: None [1]                                         Lacerations:  None [1]  Patient had a delivery of a Viable infant. 04/25/2017  Information for the patient's newborn:  Patricia, Rich [[id number]]  Delivery Method: Vaginal, Vacuum (Extractor) (Filed from Delivery Summary)    Pateint had an uncomplicated postpartum course.  She is ambulating, tolerating a regular diet, passing flatus, and urinating well. Patient is discharged home in stable condition on 04/27/17.   Physical exam  Vitals:   04/26/17 0608 04/26/17 1827 04/26/17 1912 04/27/17 0604  BP: 132/75 (!) 152/84 140/80 125/85  Pulse: (!) 105 100  (!) 101  Resp: Temp: 98.5 F (36.9 C) 98.1 F (36.7 C)  97.7 F (36.5 C)  TempSrc: Oral Oral  Oral  SpO2:      Weight:      Height:       General: alert Lochia: appropriate Uterine Fundus: firm Incision: N/A DVT Evaluation: No evidence of DVT seen on physical exam. Labs: Lab Results  Component Value Date   WBC 11.9 (H) 04/24/2017   HGB 9.2 (L) 04/24/2017   HCT 29.0 (L) 04/24/2017   MCV 86.3 04/24/2017   PLT 256 04/24/2017   CMP Latest Ref Rng  & Units 04/21/2017  Glucose 65 - 99 mg/dL 87  BUN 6 - 20 mg/dL 9  Creatinine 0.86 - 5.78 mg/dL 4.69(G)  Sodium 295 - 284 mmol/L 138  Potassium 3.5 - 5.2 mmol/L 4.5  Chloride 96 - 106 mmol/L 100  CO2 18 - 29 mmol/L 22  Calcium 8.7 - 10.2 mg/dL 9.1  Total Protein 6.0 - 8.5 g/dL 6.1  Total Bilirubin 0.0 - 1.2 mg/dL <1.3  Alkaline Phos 39 - 117 IU/L 126(H)  AST 0 - 40 IU/L 16  ALT 0 - 32 IU/L 15    Discharge instruction: per After Visit Summary and "Baby and Me Booklet".  After Visit Meds:  Allergies as of 04/27/2017   No Known Allergies     Medication List    STOP taking these medications   ferrous sulfate 325 (65 FE) MG tablet   hydrOXYzine 25 MG capsule Commonly known as:  VISTARIL  TAKE these medications   cetirizine 10 MG tablet Commonly known as:  ZYRTEC Take 10 mg by mouth daily as needed for allergies.   ibuprofen 600 MG tablet Commonly known as:  ADVIL,MOTRIN Take 1 tablet (600 mg total) by mouth every 6 (six) hours.   norethindrone 0.35 MG tablet Commonly known as:  MICRONOR,CAMILA,ERRIN Take 1 tablet (0.35 mg total) by mouth daily. Start taking pills when baby is 41 weeks old.   prenatal multivitamin Tabs tablet Take 1 tablet by mouth daily at 12 noon.   triamcinolone ointment 0.5 % Commonly known as:  KENALOG Apply 1 application topically 3 (three) times daily as needed. Apply to the affected area of skin, avoid the face.       Diet: routine diet  Activity: Advance as tolerated. Pelvic rest for 6 weeks.   Outpatient follow up:6 weeks Follow up Appt:No future appointments. Follow up visit: No Follow-up on file.  Postpartum contraception: Progesterone only pills  Newborn Data: Live born female  Birth Weight: 8 lb 7.9 oz (3853 g) APGAR: 8, 9  Baby Feeding: Breast Disposition:home with mother   04/27/2017 Allie Bossier, MD

## 2017-04-28 ENCOUNTER — Ambulatory Visit: Payer: Self-pay

## 2017-04-28 ENCOUNTER — Encounter: Payer: Medicaid Other | Admitting: Advanced Practice Midwife

## 2017-04-28 NOTE — Lactation Note (Signed)
This note was copied from a baby's chart. Lactation Consultation Note  Patient Name: Patricia Rich WUJWJ'X Date: 04/28/2017 Reason for consult: Follow-up assessment;Infant weight loss (6% weight loss, Serum Bili 13.2 ) Baby is 70 hours old .  Baby was fussy , LC checked diaper and noted the circ site not actively bleeding when checked , just noted bright blood  on the diaper. LC applied dry 2 by 2 and light pressure, no oozing noted.  Instructed on use of vaseline -moderate amount. Changed baby to clean diaper. MBURN aware  Of bleeding and to check site.  Baby showing feeding cues, LC assisted to latch in the football/ right breast/ depth achieved/  Swallows noted/ increased with breast compressions. Baby softened breast well.  Baby fed 10 mins , and baby content after feeding and fell asleep. Baby was taken off the double photo this am and will have the bilirubin repeated at 15:30.  Due to the milk being in and baby only fed on the right. LC provided ice packs for the breast while lying down for 20-30 mins , and then pump 10 mins.   LC noted a short anterior frenulum, did not discuss with parents, discussed it with Dr. Weston Brass  Also noted per mom pinching to start with latch , and improved.        Maternal Data Has patient been taught Hand Expression?: Yes  Feeding Feeding Type: Breast Fed Length of feed: 10 min (multiple swallows, , increased swallows with breast compressions )  LATCH Score/Interventions Latch: Grasps breast easily, tongue down, lips flanged, rhythmical sucking.  Audible Swallowing: Spontaneous and intermittent Intervention(s): Skin to skin;Hand expression;Alternate breast massage  Type of Nipple: Everted at rest and after stimulation  Comfort (Breast/Nipple): Filling, red/small blisters or bruises, mild/mod discomfort  Problem noted: Mild/Moderate discomfort;Filling Interventions (Filling): Massage  Hold (Positioning): Assistance needed to  correctly position infant at breast and maintain latch. Intervention(s): Breastfeeding basics reviewed;Support Pillows;Position options;Skin to skin  LATCH Score: 8  Lactation Tools Discussed/Used Tools: Pump Breast pump type: Double-Electric Breast Pump   Consult Status Consult Status: Follow-up Date: 04/28/17 Follow-up type: In-patient    [name] [name] 04/28/2017, 11:46 AM

## 2017-05-27 ENCOUNTER — Ambulatory Visit (INDEPENDENT_AMBULATORY_CARE_PROVIDER_SITE_OTHER): Payer: Medicaid Other | Admitting: Obstetrics and Gynecology

## 2017-05-27 ENCOUNTER — Encounter: Payer: Self-pay | Admitting: Obstetrics and Gynecology

## 2017-05-27 DIAGNOSIS — Z30011 Encounter for initial prescription of contraceptive pills: Secondary | ICD-10-CM

## 2017-05-27 DIAGNOSIS — Z8759 Personal history of other complications of pregnancy, childbirth and the puerperium: Secondary | ICD-10-CM | POA: Insufficient documentation

## 2017-05-27 NOTE — Progress Notes (Signed)
Patient complain of soreness still in her vaginal area since giving birth. Patient is not sexually active and is taking birthcontrol pills.

## 2017-05-27 NOTE — Patient Instructions (Signed)
Breastfeeding Deciding to breastfeed is one of the best choices you can make for you and your baby. A change in hormones during pregnancy causes your breast tissue to grow and increases the number and size of your milk ducts. These hormones also allow proteins, sugars, and fats from your blood supply to make breast milk in your milk-producing glands. Hormones prevent breast milk from being released before your baby is born as well as prompt milk flow after birth. Once breastfeeding has begun, thoughts of your baby, as well as his or her sucking or crying, can stimulate the release of milk from your milk-producing glands. Benefits of breastfeeding For Your Baby  Your first milk (colostrum) helps your baby's digestive system function better.  There are antibodies in your milk that help your baby fight off infections.  Your baby has a lower incidence of asthma, allergies, and sudden infant death syndrome.  The nutrients in breast milk are better for your baby than infant formulas and are designed uniquely for your baby's needs.  Breast milk improves your baby's brain development.  Your baby is less likely to develop other conditions, such as childhood obesity, asthma, or type 2 diabetes mellitus.  For You  Breastfeeding helps to create a very special bond between you and your baby.  Breastfeeding is convenient. Breast milk is always available at the correct temperature and costs nothing.  Breastfeeding helps to burn calories and helps you lose the weight gained during pregnancy.  Breastfeeding makes your uterus contract to its prepregnancy size faster and slows bleeding (lochia) after you give birth.  Breastfeeding helps to lower your risk of developing type 2 diabetes mellitus, osteoporosis, and breast or ovarian cancer later in life.  Signs that your baby is hungry Early Signs of Hunger  Increased alertness or activity.  Stretching.  Movement of the head from side to  side.  Movement of the head and opening of the mouth when the corner of the mouth or cheek is stroked (rooting).  Increased sucking sounds, smacking lips, cooing, sighing, or squeaking.  Hand-to-mouth movements.  Increased sucking of fingers or hands.  Late Signs of Hunger  Fussing.  Intermittent crying.  Extreme Signs of Hunger Signs of extreme hunger will require calming and consoling before your baby will be able to breastfeed successfully. Do not wait for the following signs of extreme hunger to occur before you initiate breastfeeding:  Restlessness.  A loud, strong cry.  Screaming.  Breastfeeding basics Breastfeeding Initiation  Find a comfortable place to sit or lie down, with your neck and back well supported.  Place a pillow or rolled up blanket under your baby to bring him or her to the level of your breast (if you are seated). Nursing pillows are specially designed to help support your arms and your baby while you breastfeed.  Make sure that your baby's abdomen is facing your abdomen.  Gently massage your breast. With your fingertips, massage from your chest wall toward your nipple in a circular motion. This encourages milk flow. You may need to continue this action during the feeding if your milk flows slowly.  Support your breast with 4 fingers underneath and your thumb above your nipple. Make sure your fingers are well away from your nipple and your baby's mouth.  Stroke your baby's lips gently with your finger or nipple.  When your baby's mouth is open wide enough, quickly bring your baby to your breast, placing your entire nipple and as much of the colored area   around your nipple (areola) as possible into your baby's mouth. ? More areola should be visible above your baby's upper lip than below the lower lip. ? Your baby's tongue should be between his or her lower gum and your breast.  Ensure that your baby's mouth is correctly positioned around your nipple  (latched). Your baby's lips should create a seal on your breast and be turned out (everted).  It is common for your baby to suck about 2-3 minutes in order to start the flow of breast milk.  Latching Teaching your baby how to latch on to your breast properly is very important. An improper latch can cause nipple pain and decreased milk supply for you and poor weight gain in your baby. Also, if your baby is not latched onto your nipple properly, he or she may swallow some air during feeding. This can make your baby fussy. Burping your baby when you switch breasts during the feeding can help to get rid of the air. However, teaching your baby to latch on properly is still the best way to prevent fussiness from swallowing air while breastfeeding. Signs that your baby has successfully latched on to your nipple:  Silent tugging or silent sucking, without causing you pain.  Swallowing heard between every 3-4 sucks.  Muscle movement above and in front of his or her ears while sucking.  Signs that your baby has not successfully latched on to nipple:  Sucking sounds or smacking sounds from your baby while breastfeeding.  Nipple pain.  If you think your baby has not latched on correctly, slip your finger into the corner of your baby's mouth to break the suction and place it between your baby's gums. Attempt breastfeeding initiation again. Signs of Successful Breastfeeding Signs from your baby:  A gradual decrease in the number of sucks or complete cessation of sucking.  Falling asleep.  Relaxation of his or her body.  Retention of a small amount of milk in his or her mouth.  Letting go of your breast by himself or herself.  Signs from you:  Breasts that have increased in firmness, weight, and size 1-3 hours after feeding.  Breasts that are softer immediately after breastfeeding.  Increased milk volume, as well as a change in milk consistency and color by the fifth day of  breastfeeding.  Nipples that are not sore, cracked, or bleeding.  Signs That Your Baby is Getting Enough Milk  Wetting at least 1-2 diapers during the first 24 hours after birth.  Wetting at least 5-6 diapers every 24 hours for the first week after birth. The urine should be clear or pale yellow by 5 days after birth.  Wetting 6-8 diapers every 24 hours as your baby continues to grow and develop.  At least 3 stools in a 24-hour period by age 5 days. The stool should be soft and yellow.  At least 3 stools in a 24-hour period by age 7 days. The stool should be seedy and yellow.  No loss of weight greater than 10% of birth weight during the first 3 days of age.  Average weight gain of 4-7 ounces (113-198 g) per week after age 4 days.  Consistent daily weight gain by age 5 days, without weight loss after the age of 2 weeks.  After a feeding, your baby may spit up a small amount. This is common. Breastfeeding frequency and duration Frequent feeding will help you make more milk and can prevent sore nipples and breast engorgement. Breastfeed when   you feel the need to reduce the fullness of your breasts or when your baby shows signs of hunger. This is called "breastfeeding on demand." Avoid introducing a pacifier to your baby while you are working to establish breastfeeding (the first 4-6 weeks after your baby is born). After this time you may choose to use a pacifier. Research has shown that pacifier use during the first year of a baby's life decreases the risk of sudden infant death syndrome (SIDS). Allow your baby to feed on each breast as long as he or she wants. Breastfeed until your baby is finished feeding. When your baby unlatches or falls asleep while feeding from the first breast, offer the second breast. Because newborns are often sleepy in the first few weeks of life, you may need to awaken your baby to get him or her to feed. Breastfeeding times will vary from baby to baby. However,  the following rules can serve as a guide to help you ensure that your baby is properly fed:  Newborns (babies 4 weeks of age or younger) may breastfeed every 1-3 hours.  Newborns should not go longer than 3 hours during the day or 5 hours during the night without breastfeeding.  You should breastfeed your baby a minimum of 8 times in a 24-hour period until you begin to introduce solid foods to your baby at around 6 months of age.  Breast milk pumping Pumping and storing breast milk allows you to ensure that your baby is exclusively fed your breast milk, even at times when you are unable to breastfeed. This is especially important if you are going back to work while you are still breastfeeding or when you are not able to be present during feedings. Your lactation consultant can give you guidelines on how long it is safe to store breast milk. A breast pump is a machine that allows you to pump milk from your breast into a sterile bottle. The pumped breast milk can then be stored in a refrigerator or freezer. Some breast pumps are operated by hand, while others use electricity. Ask your lactation consultant which type will work best for you. Breast pumps can be purchased, but some hospitals and breastfeeding support groups lease breast pumps on a monthly basis. A lactation consultant can teach you how to hand express breast milk, if you prefer not to use a pump. Caring for your breasts while you breastfeed Nipples can become dry, cracked, and sore while breastfeeding. The following recommendations can help keep your breasts moisturized and healthy:  Avoid using soap on your nipples.  Wear a supportive bra. Although not required, special nursing bras and tank tops are designed to allow access to your breasts for breastfeeding without taking off your entire bra or top. Avoid wearing underwire-style bras or extremely tight bras.  Air dry your nipples for 3-4minutes after each feeding.  Use only cotton  bra pads to absorb leaked breast milk. Leaking of breast milk between feedings is normal.  Use lanolin on your nipples after breastfeeding. Lanolin helps to maintain your skin's normal moisture barrier. If you use pure lanolin, you do not need to wash it off before feeding your baby again. Pure lanolin is not toxic to your baby. You may also hand express a few drops of breast milk and gently massage that milk into your nipples and allow the milk to air dry.  In the first few weeks after giving birth, some women experience extremely full breasts (engorgement). Engorgement can make your   breasts feel heavy, warm, and tender to the touch. Engorgement peaks within 3-5 days after you give birth. The following recommendations can help ease engorgement:  Completely empty your breasts while breastfeeding or pumping. You may want to start by applying warm, moist heat (in the shower or with warm water-soaked hand towels) just before feeding or pumping. This increases circulation and helps the milk flow. If your baby does not completely empty your breasts while breastfeeding, pump any extra milk after he or she is finished.  Wear a snug bra (nursing or regular) or tank top for 1-2 days to signal your body to slightly decrease milk production.  Apply ice packs to your breasts, unless this is too uncomfortable for you.  Make sure that your baby is latched on and positioned properly while breastfeeding.  If engorgement persists after 48 hours of following these recommendations, contact your health care provider or a lactation consultant. Overall health care recommendations while breastfeeding  Eat healthy foods. Alternate between meals and snacks, eating 3 of each per day. Because what you eat affects your breast milk, some of the foods may make your baby more irritable than usual. Avoid eating these foods if you are sure that they are negatively affecting your baby.  Drink milk, fruit juice, and water to  satisfy your thirst (about 10 glasses a day).  Rest often, relax, and continue to take your prenatal vitamins to prevent fatigue, stress, and anemia.  Continue breast self-awareness checks.  Avoid chewing and smoking tobacco. Chemicals from cigarettes that pass into breast milk and exposure to secondhand smoke may harm your baby.  Avoid alcohol and drug use, including marijuana. Some medicines that may be harmful to your baby can pass through breast milk. It is important to ask your health care provider before taking any medicine, including all over-the-counter and prescription medicine as well as vitamin and herbal supplements. It is possible to become pregnant while breastfeeding. If birth control is desired, ask your health care provider about options that will be safe for your baby. Contact a health care provider if:  You feel like you want to stop breastfeeding or have become frustrated with breastfeeding.  You have painful breasts or nipples.  Your nipples are cracked or bleeding.  Your breasts are red, tender, or warm.  You have a swollen area on either breast.  You have a fever or chills.  You have nausea or vomiting.  You have drainage other than breast milk from your nipples.  Your breasts do not become full before feedings by the fifth day after you give birth.  You feel sad and depressed.  Your baby is too sleepy to eat well.  Your baby is having trouble sleeping.  Your baby is wetting less than 3 diapers in a 24-hour period.  Your baby has less than 3 stools in a 24-hour period.  Your baby's skin or the white part of his or her eyes becomes yellow.  Your baby is not gaining weight by 5 days of age. Get help right away if:  Your baby is overly tired (lethargic) and does not want to wake up and feed.  Your baby develops an unexplained fever. This information is not intended to replace advice given to you by your health care provider. Make sure you discuss  any questions you have with your health care provider. Document Released: 12/14/2005 Document Revised: 05/27/2016 Document Reviewed: 06/07/2013 Elsevier Interactive Patient Education  2017 [employer]  

## 2017-05-27 NOTE — Progress Notes (Signed)
Subjective:     Patricia Rich is a 21 y.o. female who presents for a postpartum visit. She is 4 weeks postpartum following a spontaneous vaginal delivery. I have fully reviewed the prenatal and intrapartum course. The delivery was at 39 gestational weeks. Outcome: vacuum, low. Anesthesia: epidural. Postpartum course has been uncomplicated. Baby's course has been complicated by jaundice and s/p phototherapy.[name]. Baby is feeding by breast. Bleeding staining only. Bowel function is normal. Bladder function is normal. Patient is not sexually active. Perineum feel like "bruise."  Contraception method is birthcontrol pills. Postpartum depression screening: negative. Not planning return to work. Happy with OCPs.    Review of Systems Pertinent items noted in HPI and remainder of comprehensive ROS otherwise negative.   Objective:    BP 107/70   Pulse 86   Ht 5\' 3"  (1.6 m)   Wt 162 lb 9.6 oz (73.8 kg)   LMP 05/31/2016 (Exact Date)   Breastfeeding? Yes   BMI 28.80 kg/m   General:  alert, cooperative and no distress   Breasts:  inspection negative, no nipple discharge or bleeding, no masses or nodularity palpable and lactational; no discrete masses or lymphadenopathy; nipples protracted and no imflammation noted  Lungs: clear to auscultation bilaterally  Heart:  regular rate and rhythm, S1, S2 normal, no murmur, click, rub or gallop  Abdomen: soft, non-tender; bowel sounds normal; no masses,  no organomegaly Mild diastasis. W   Vulva:  normal  Vagina: normal vagina  Cervix:  not evaluated  Corpus: not examined well involuted aby abdominal exam  Adnexa:  not evaluated  Rectal Exam: Not performed.        Assessment:     4 wks postpartum exam. Pap smear not done at today's visit.   Plan:    1. Contraception: OCP (estrogen/progesterone) 2. Continue PNV until 6 wks PP 3. Follow up in: 6 months or as needed.

## 2017-06-08 ENCOUNTER — Ambulatory Visit: Payer: Medicaid Other | Admitting: Advanced Practice Midwife

## 2018-04-07 IMAGING — US US MFM OB COMP +14 WKS
1 series · 13 of 28 positions shown · non-contrast
Comparison: none

[Series 1: us mfm ob comp +14 wks · 13 of 72 slices shown]
[im 3/72]
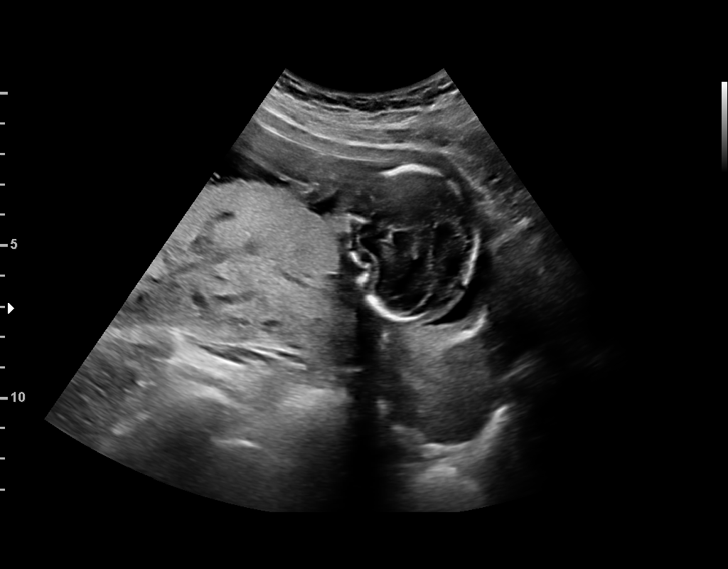
[im 8/72]
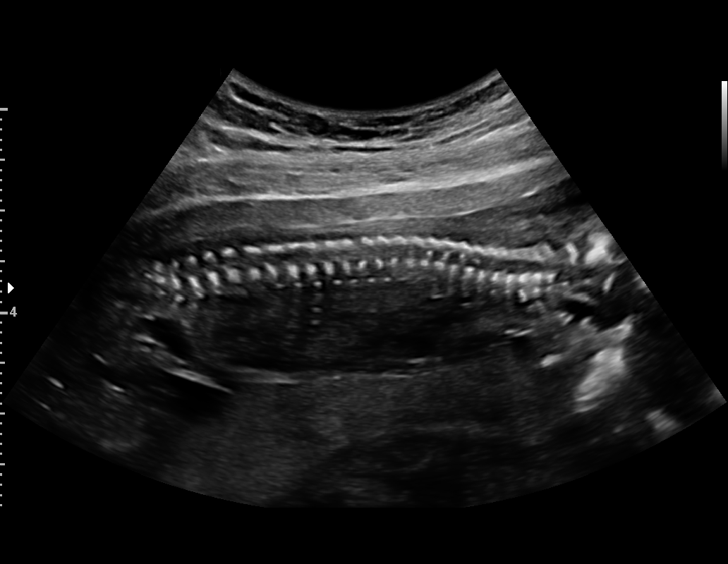
[im 14/72]
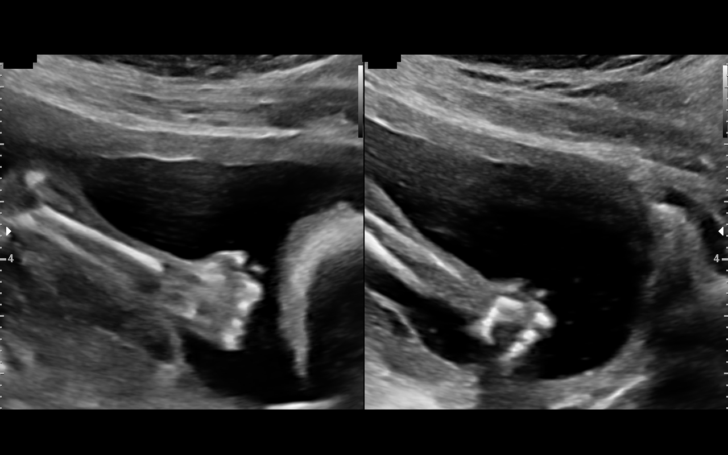
[im 19/72]
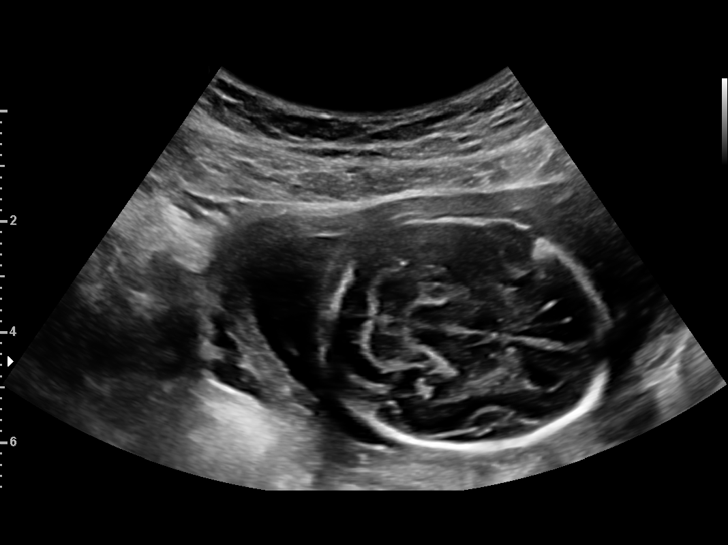
[im 24/72]
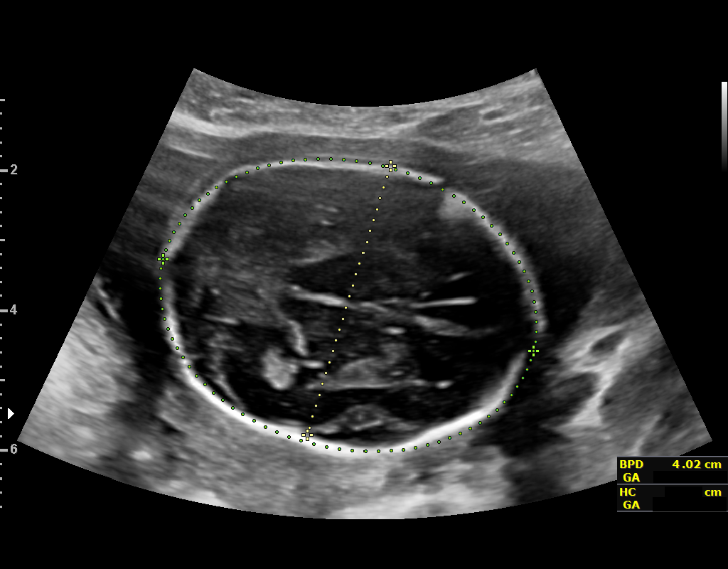
[im 29/72]
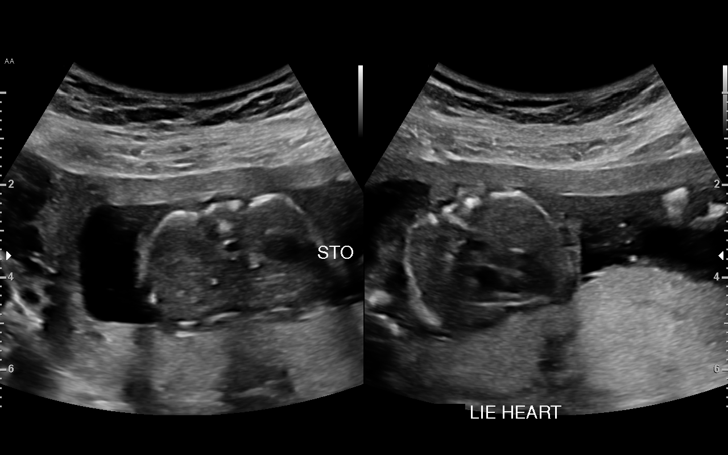
[im 37/72]
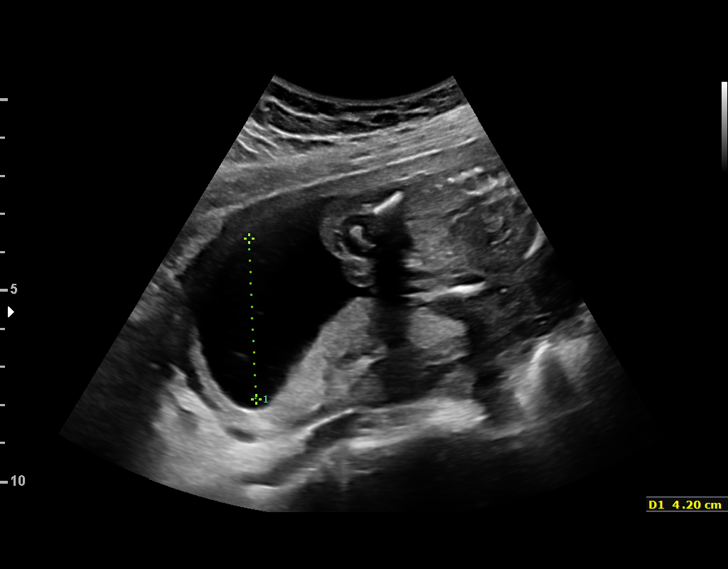
[im 43/72]
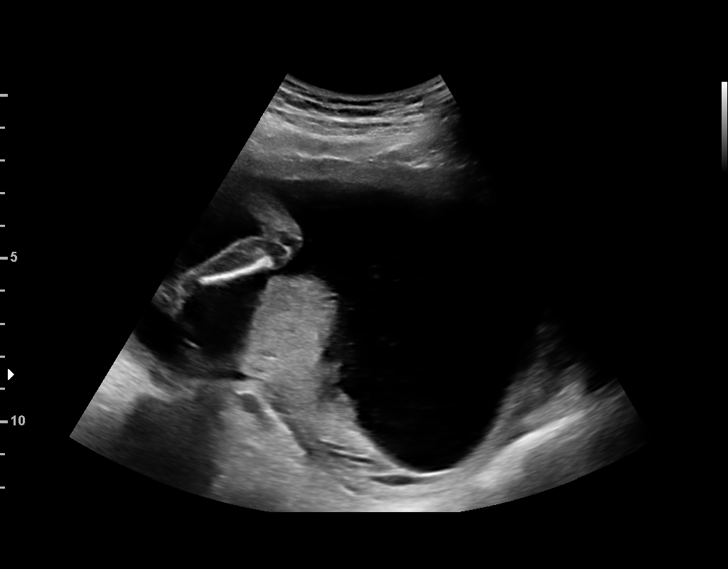
[im 48/72]
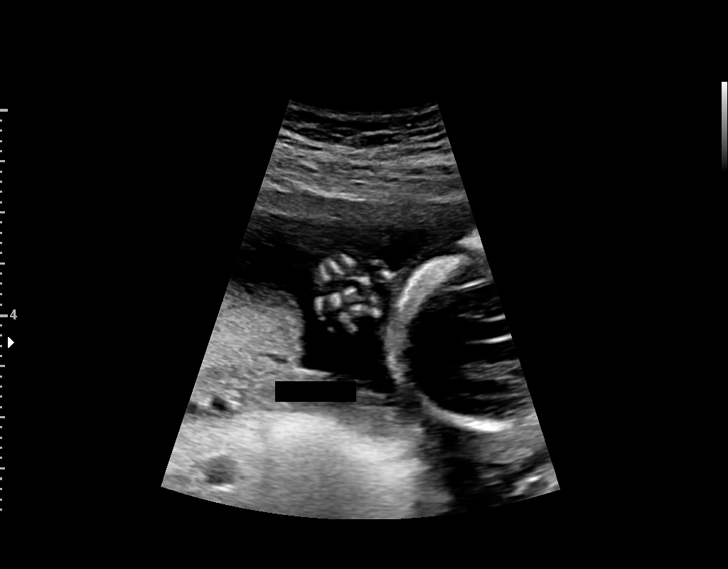
[im 53/72]
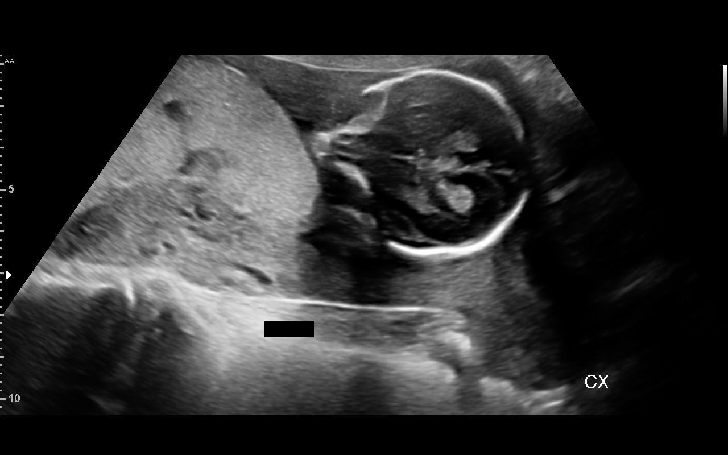
[im 58/72]
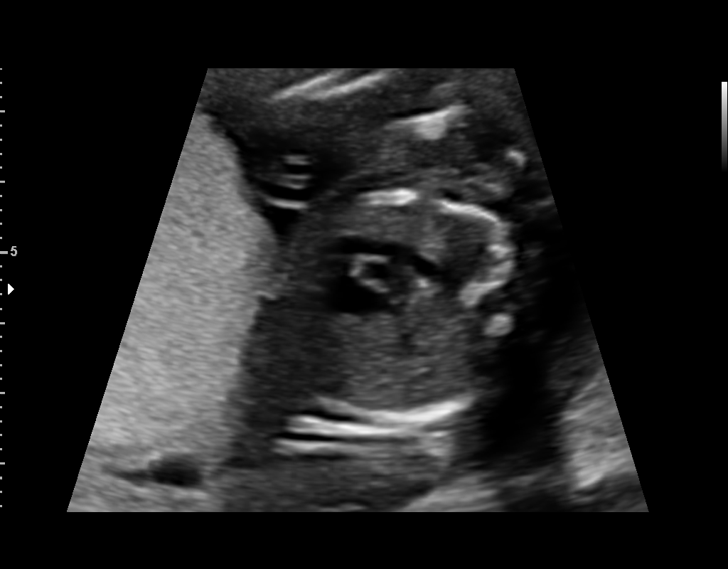
[im 64/72]
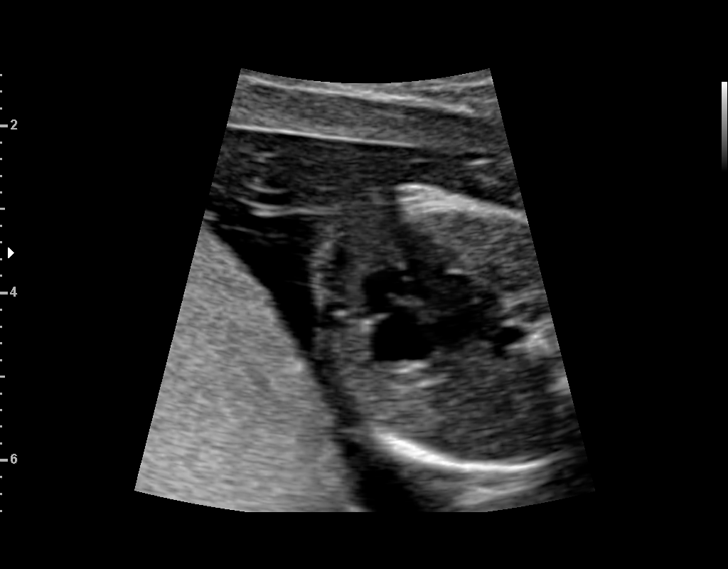
[im 69/72]
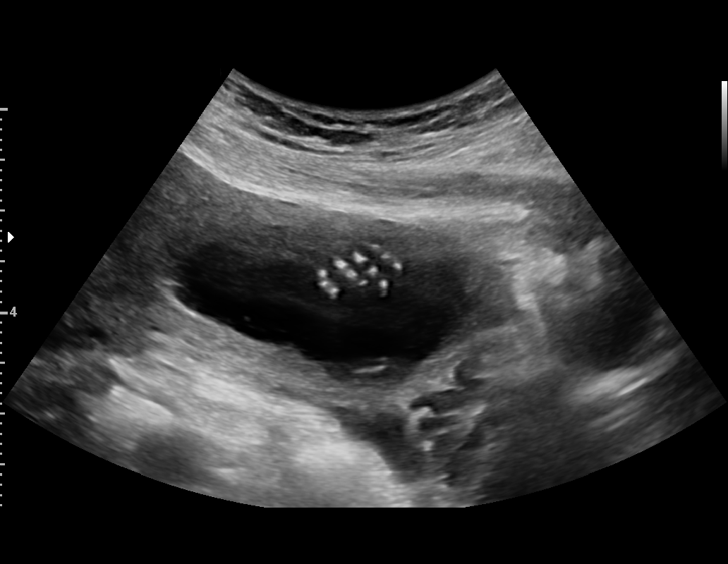

[13 of 28 positions shown; findings below may reference images not displayed]

[facility] [HOSPITAL]

1  OTNIEL FRIES         [id number]      [id number]     [id number]
Indications

Antenatal screening for malformations
18 weeks gestation of pregnancy
OB History

Gravidity:    1         Term:   1
Fetal Evaluation

Num Of Fetuses:     1
Fetal Heart         151
Rate(bpm):
Cardiac Activity:   Observed
Presentation:       Cephalic
Placenta:           Posterior, above cervical os
P. Cord Insertion:  Visualized, central

Amniotic Fluid
AFI FV:      Subjectively within normal limits
Biometry

BPD:      40.5  mm     G. Age:  18w 2d         57  %    CI:        70.72   %   70 - 86
FL/HC:      17.5   %   15.8 - 18
HC:      153.5  mm     G. Age:  18w 2d         52  %    HC/AC:      1.25       1.07 -
AC:      123.1  mm     G. Age:  17w 6d         41  %    FL/BPD:     66.4   %
FL:       26.9  mm     G. Age:  18w 1d         47  %    FL/AC:      21.9   %   20 - 24
CER:      18.2  mm     G. Age:  18w 1d         48  %
NFT:       1.4  mm

CM:        4.8  mm

Est. FW:     224  gm      0 lb 8 oz     48  %
Gestational Age

U/S Today:     18w 1d                                        EDD:   05/02/17
Best:          18w 1d    Det. By:   U/S (11/30/16)           EDD:   05/02/17
Anatomy

Cranium:               Appears normal         Aortic Arch:            Appears normal
Cavum:                 Appears normal         Ductal Arch:            Not well visualized
Ventricles:            Appears normal         Diaphragm:              Appears normal
Choroid Plexus:        Appears normal         Stomach:                Appears normal, left
sided
Cerebellum:            Appears normal         Abdomen:                Appears normal
Posterior Fossa:       Appears normal         Abdominal Wall:         Appears nml (cord
insert, abd wall)
Nuchal Fold:           Appears normal         Cord Vessels:           Appears normal (3
vessel cord)
Face:                  Orbits nl; profile not Kidneys:                Appear normal
well visualized
Lips:                  Appears normal         Bladder:                Appears normal
Thoracic:              Appears normal         Spine:                  Appears normal
Heart:                 Appears normal         Upper Extremities:      Appears normal
(4CH, axis, and
situs)
RVOT:                  Appears normal         Lower Extremities:      Appears normal
LVOT:                  Appears normal

Other:  Fetus appears to be a male. Heels and 5th digit visualized.
Cervix Uterus Adnexa

Cervix
Length:           3.55  cm.
Normal appearance by transabdominal scan.

Left Ovary
Within normal limits.

Right Ovary
Within normal limits.
Impression

SIUP at 18+1 weeks
Normal detailed fetal anatomy; limited views of profile and DA
Markers of aneuploidy: none
Normal amniotic fluid volume
EDC based on today's measurements: 05/02/17
Recommendations

Follow-up as clinically indicated

## 2018-10-12 ENCOUNTER — Other Ambulatory Visit (HOSPITAL_COMMUNITY): Payer: Self-pay | Admitting: Obstetrics & Gynecology

## 2019-09-18 ENCOUNTER — Ambulatory Visit (HOSPITAL_COMMUNITY)
Admission: EM | Admit: 2019-09-18 | Discharge: 2019-09-18 | Disposition: A | Discharge status: Home / Self Care | Payer: Self-pay

## 2019-09-18 ENCOUNTER — Ambulatory Visit (HOSPITAL_COMMUNITY)
Admission: EM | Admit: 2019-09-18 | Discharge: 2019-09-18 | Disposition: A | Discharge status: Home / Self Care | Payer: Self-pay | Attending: Urgent Care | Admitting: Urgent Care

## 2019-09-18 ENCOUNTER — Encounter (HOSPITAL_COMMUNITY): Payer: Self-pay | Admitting: Emergency Medicine

## 2019-09-18 ENCOUNTER — Other Ambulatory Visit: Payer: Self-pay

## 2019-09-18 DIAGNOSIS — M542 Cervicalgia: Secondary | ICD-10-CM

## 2019-09-18 DIAGNOSIS — R413 Other amnesia: Secondary | ICD-10-CM

## 2019-09-18 DIAGNOSIS — S0990XA Unspecified injury of head, initial encounter: Secondary | ICD-10-CM

## 2019-09-18 DIAGNOSIS — G44209 Tension-type headache, unspecified, not intractable: Secondary | ICD-10-CM

## 2019-09-18 MED ORDER — MELOXICAM 7.5 MG PO TABS: 7.5000 mg | ORAL_TABLET | Freq: Every day | ORAL | 0 refills | Status: DC

## 2019-09-18 MED ORDER — CYCLOBENZAPRINE HCL 5 MG PO TABS: 5.0000 mg | ORAL_TABLET | Freq: Every day | ORAL | 0 refills | Status: AC
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
## 2019-09-18 NOTE — ED Notes (Signed)
System kicked this name out and populated chart based on familiar data for this patient, recently married
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
## 2021-12-31 ENCOUNTER — Emergency Department: Payer: BLUE CROSS/BLUE SHIELD

## 2021-12-31 ENCOUNTER — Other Ambulatory Visit: Payer: Self-pay

## 2021-12-31 ENCOUNTER — Emergency Department
Admission: EM | Admit: 2021-12-31 | Discharge: 2021-12-31 | Disposition: A | Discharge status: Home / Self Care | Payer: BLUE CROSS/BLUE SHIELD | Attending: Emergency Medicine | Admitting: Emergency Medicine

## 2021-12-31 DIAGNOSIS — J209 Acute bronchitis, unspecified: Secondary | ICD-10-CM | POA: Diagnosis not present

## 2021-12-31 DIAGNOSIS — R0602 Shortness of breath: Secondary | ICD-10-CM | POA: Diagnosis present

## 2021-12-31 DIAGNOSIS — Z20822 Contact with and (suspected) exposure to covid-19: Secondary | ICD-10-CM | POA: Diagnosis not present

## 2021-12-31 LAB — COMPREHENSIVE METABOLIC PANEL
ALT: 19 U/L (ref 0–44)
AST: 22 U/L (ref 15–41)
Albumin: 4 g/dL (ref 3.5–5.0)
Alkaline Phosphatase: 55 U/L (ref 38–126)
Anion gap: 8 (ref 5–15)
BUN: 12 mg/dL (ref 6–20)
CO2: 24 mmol/L (ref 22–32)
Calcium: 9.4 mg/dL (ref 8.9–10.3)
Chloride: 108 mmol/L (ref 98–111)
Creatinine, Ser: 0.58 mg/dL (ref 0.44–1.00)
GFR, Estimated: >60 mL/min (ref >60)
Glucose, Bld: 74 mg/dL (ref 70–99)
Potassium: 3.6 mmol/L (ref 3.5–5.1)
Sodium: 140 mmol/L (ref 135–145)
Total Bilirubin: 0.3 mg/dL (ref 0.3–1.2)
Total Protein: 8.5 g/dL — ABNORMAL HIGH (ref 6.5–8.1)

## 2021-12-31 LAB — CBC
HCT: 41.4 % (ref 36.0–46.0)
Hemoglobin: 13.4 g/dL (ref 12.0–15.0)
MCH: 30.5 pg (ref 26.0–34.0)
MCHC: 32.4 g/dL (ref 30.0–36.0)
MCV: 94.3 fL (ref 80.0–100.0)
Platelets: 331 10*3/uL (ref 150–400)
RBC: 4.39 MIL/uL (ref 3.87–5.11)
RDW: 11.7 % (ref 11.5–15.5)
WBC: 8.3 10*3/uL (ref 4.0–10.5)
nRBC: 0 % (ref 0.0–0.2)

## 2021-12-31 LAB — RESP PANEL BY RT-PCR (FLU A&B, COVID) ARPGX2
Influenza A by PCR: NEGATIVE
Influenza B by PCR: NEGATIVE
SARS Coronavirus 2 by RT PCR: NEGATIVE

## 2021-12-31 LAB — GROUP A STREP BY PCR: Group A Strep by PCR: NOT DETECTED

## 2021-12-31 MED ORDER — PREDNISONE 20 MG PO TABS: 40.0000 mg | ORAL_TABLET | Freq: Every day | ORAL | 0 refills | Status: AC

## 2021-12-31 MED ORDER — ALBUTEROL SULFATE (2.5 MG/3ML) 0.083% IN NEBU
2.5000 mg | INHALATION_SOLUTION | Freq: Once | RESPIRATORY_TRACT | Status: AC
  Administered 2021-12-31: 2.5 mg via RESPIRATORY_TRACT
  Filled 2021-12-31: qty 3

## 2021-12-31 MED ORDER — ALBUTEROL SULFATE HFA 108 (90 BASE) MCG/ACT IN AERS: 2.0000 | INHALATION_SPRAY | RESPIRATORY_TRACT | 0 refills | Status: AC | PRN

## 2021-12-31 NOTE — ED Provider Notes (Signed)
[facility] Provider Note    Event Date/Time   First MD Initiated Contact with Patient 12/31/21 1933     (approximate)   History   Shortness of Breath and Hoarse   HPI  Patricia Rich is a 26 y.o. female with no active medical problems who presents with chest tightness and shortness of breath over the last 1 to 2 days, worsened today, associated with losing her voice.  The patient also reports a burning sensation in her throat and chest.  She states that she initially got sick with cough and congestion last week although the symptoms improved.  She had a negative COVID test at home several days ago.  The patient denies any difficulty swallowing.  She has no vomiting or diarrhea.  She denies any sick contacts.     Physical Exam   Triage Vital Signs: ED Triage Vitals  Enc Vitals Group     BP 12/31/21 1914 128/83     Pulse Rate 12/31/21 1914 96     Resp 12/31/21 1914 18     Temp 12/31/21 1914 98.4 F (36.9 C)     Temp Source 12/31/21 1914 Oral     SpO2 12/31/21 1914 100 %     Weight 12/31/21 1915 180 lb (81.6 kg)     Height 12/31/21 1915 5\' 3"  (1.6 m)     Head Circumference --      Peak Flow --      Pain Score 12/31/21 1915 0     Pain Loc --      Pain Edu? --      Excl. in GC? --     Most recent vital signs: Vitals:   12/31/21 1914 12/31/21 2134  BP: 128/83 (!) 111/53  Pulse: 96 (!) 106  Resp: 18 16  Temp: 98.4 F (36.9 C)   SpO2: 100% 98%    General: Awake, no distress.  CV:  Good peripheral perfusion.  Normal heart sounds. Resp:  Normal effort.  Slightly prolonged expiratory phase but no wheezing. Abd:  No distention.  Other:  Oropharynx clear with no erythema or exudate.  Slightly hoarse voice but no stridor or pooled secretions.  No cervical lymphadenopathy.   ED Results / Procedures / Treatments   Labs (all labs ordered are listed, but only abnormal results are displayed) Labs Reviewed  COMPREHENSIVE METABOLIC PANEL -  Abnormal; Notable for the following components:      Result Value   Total Protein 8.5 (*)    All other components within normal limits  RESP PANEL BY RT-PCR (FLU A&B, COVID) ARPGX2  GROUP A STREP BY PCR  CBC     EKG  ED ECG REPORT I, 02/28/22, the attending physician, personally viewed and interpreted this ECG.  Date: 12/31/2021 EKG Time: 1913 Rate: 105 Rhythm: Sinus tachycardia QRS Axis: normal Intervals: normal ST/T Wave abnormalities: normal Narrative Interpretation: no evidence of acute ischemia    RADIOLOGY  Chest x-ray images interpreted by me show no focal consolidation or edema; I reviewed the radiology report which confirms mild right atelectasis with no consolidation or other acute abnormality.  PROCEDURES:  Critical Care performed: No  Procedures   MEDICATIONS ORDERED IN ED: Medications  albuterol (PROVENTIL) (2.5 MG/3ML) 0.083% nebulizer solution 2.5 mg (2.5 mg Nebulization Given 12/31/21 2036)     IMPRESSION / MDM / ASSESSMENT AND PLAN / ED COURSE  I reviewed the triage vital signs and the nursing notes.  26 year old female with no active medical  problems presents with shortness of breath, chest tightness, and sore throat after initially having viral URI symptoms last week that have mostly resolved.  On exam the patient is well-appearing.  She was borderline tachycardic at triage but this has resolved.  Her other vital signs are normal.  She has slightly prolonged expiratory phase but no frank wheezing.  Oropharynx is clear.  Airway is intact.  Differential diagnosis includes, but is not limited to, viral bronchitis, COVID-19, influenza, pneumonia, strep throat.  We will obtain basic labs, respiratory panel, strep swab, and give a trial of albuterol.  ----------------------------------------- 10:47 PM on 12/31/2021 -----------------------------------------  The patient reported significant improvement of her symptoms after the albuterol  and appeared more comfortable.  Her voice was somewhat clear.  Chest x-ray shows some atelectasis but no other acute abnormality, and the strep swab and respiratory panel were negative.  The patient was stable for discharge home.  I counseled her on the results of the work-up.  I prescribed her albuterol and prednisone for acute bronchitis.  Return precautions given, and she expressed understanding.    FINAL CLINICAL IMPRESSION(S) / ED DIAGNOSES   Final diagnoses:  Acute bronchitis, unspecified organism     Rx / DC Orders   ED Discharge Orders          Ordered    predniSONE (DELTASONE) 20 MG tablet  Daily with breakfast        12/31/21 2116    albuterol (VENTOLIN HFA) 108 (90 Base) MCG/ACT inhaler  Every 4 hours PRN        12/31/21 2116             Note:  This document was prepared using Dragon voice recognition software and may include unintentional dictation errors.    Dionne Bucy, MD 12/31/21 2248

## 2021-12-31 NOTE — Discharge Instructions (Addendum)
Take the prednisone daily with breakfast for the next several days.  Use the albuterol up to every 4 hours as needed for shortness of breath or chest tightness.  Return to the ER for new, worsening, or persistent severe shortness of breath, chest tightness or pain, fever, weakness, vomiting, or any other new or worsening symptoms that concern you.

## 2021-12-31 NOTE — ED Triage Notes (Signed)
Pt states that she started to get sick a week ago and took an at home covid test that was negative, pt states that by Monday she started to lose her voice and now states that she feels like she is having some difficulty breathing. Pt states that she feels like she is having to force air through her throat. Denies a sore throat at this time

## 2022-01-01 ENCOUNTER — Ambulatory Visit: Payer: Self-pay

## 2024-03-20 ENCOUNTER — Emergency Department
Admission: EM | Admit: 2024-03-20 | Discharge: 2024-03-20 | Disposition: A | Discharge status: Home / Self Care | Attending: Emergency Medicine | Admitting: Emergency Medicine

## 2024-03-20 DIAGNOSIS — S161XXS Strain of muscle, fascia and tendon at neck level, sequela: Secondary | ICD-10-CM

## 2024-03-20 DIAGNOSIS — W228XXA Striking against or struck by other objects, initial encounter: Secondary | ICD-10-CM | POA: Insufficient documentation

## 2024-03-20 DIAGNOSIS — R519 Headache, unspecified: Secondary | ICD-10-CM

## 2024-03-20 DIAGNOSIS — S161XXA Strain of muscle, fascia and tendon at neck level, initial encounter: Secondary | ICD-10-CM | POA: Diagnosis not present

## 2024-03-20 MED ORDER — AMOXICILLIN-POT CLAVULANATE ER 1000-62.5 MG PO TB12: 1.0000 | ORAL_TABLET | Freq: Two times a day (BID) | ORAL | 0 refills | Status: AC

## 2024-03-20 MED ORDER — PREDNISONE 50 MG PO TABS: ORAL_TABLET | ORAL | 0 refills | Status: AC

## 2024-03-20 MED ORDER — MELOXICAM 15 MG PO TABS: 15.0000 mg | ORAL_TABLET | Freq: Every day | ORAL | 0 refills | Status: AC

## 2024-03-20 NOTE — Discharge Instructions (Addendum)
 Being diagnosed with sinus headache, cervical strain.  Please take prednisone 1 tablet with breakfast, take Augmentin 1 tablet by mouth every 12 hours for 7 days.  Please take meloxicam 1 tablet every day for 7 days.  Come back to ED or go to your PCP if you have new symptoms or symptoms worsen.
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
